# Patient Record
Sex: Male | Born: 1970
Health system: Southern US, Community
[De-identification: ages and names within clinical notes are randomized; demographics above are authoritative.]

## PROBLEM LIST (undated history)

## (undated) DIAGNOSIS — D573 Sickle-cell trait: Secondary | ICD-10-CM

## (undated) DIAGNOSIS — E785 Hyperlipidemia, unspecified: Secondary | ICD-10-CM

## (undated) HISTORY — DX: Hyperlipidemia, unspecified: E78.5

## (undated) HISTORY — PX: COLONOSCOPY: SHX174

## (undated) HISTORY — PX: WISDOM TOOTH EXTRACTION: SHX21

---

## 1998-08-18 ENCOUNTER — Ambulatory Visit (HOSPITAL_COMMUNITY): Admission: RE | Admit: 1998-08-18 | Discharge: 1998-08-18 | Payer: Self-pay | Admitting: Family Medicine

## 1998-08-18 ENCOUNTER — Encounter: Payer: Self-pay | Admitting: Family Medicine

## 2002-08-13 ENCOUNTER — Emergency Department (HOSPITAL_COMMUNITY): Admission: EM | Admit: 2002-08-13 | Discharge: 2002-08-13 | Payer: Self-pay | Admitting: Emergency Medicine

## 2005-09-09 ENCOUNTER — Emergency Department (HOSPITAL_COMMUNITY): Admission: EM | Admit: 2005-09-09 | Discharge: 2005-09-09 | Payer: Self-pay | Admitting: Emergency Medicine

## 2006-11-22 ENCOUNTER — Emergency Department (HOSPITAL_COMMUNITY): Admission: EM | Admit: 2006-11-22 | Discharge: 2006-11-22 | Payer: Self-pay | Admitting: Emergency Medicine

## 2010-04-06 ENCOUNTER — Encounter: Admission: RE | Admit: 2010-04-06 | Discharge: 2010-04-06 | Payer: Self-pay | Admitting: Family Medicine

## 2010-04-07 ENCOUNTER — Encounter: Admission: RE | Admit: 2010-04-07 | Discharge: 2010-04-07 | Payer: Self-pay | Admitting: Family Medicine

## 2011-06-04 ENCOUNTER — Other Ambulatory Visit: Payer: Self-pay

## 2011-10-06 ENCOUNTER — Other Ambulatory Visit: Payer: Self-pay | Admitting: Family Medicine

## 2011-10-07 ENCOUNTER — Ambulatory Visit
Admission: RE | Admit: 2011-10-07 | Discharge: 2011-10-07 | Disposition: A | Discharge status: Home / Self Care | Payer: PRIVATE HEALTH INSURANCE | Source: Ambulatory Visit | Attending: Family Medicine | Admitting: Family Medicine

## 2011-10-07 MED ORDER — IOHEXOL 300 MG/ML  SOLN
125.0000 mL | Freq: Once | INTRAMUSCULAR | Status: AC | PRN
  Administered 2011-10-07: 125 mL via INTRAVENOUS

## 2012-04-04 ENCOUNTER — Emergency Department (HOSPITAL_COMMUNITY): Payer: PRIVATE HEALTH INSURANCE

## 2012-04-04 ENCOUNTER — Observation Stay (HOSPITAL_COMMUNITY): Payer: PRIVATE HEALTH INSURANCE

## 2012-04-04 ENCOUNTER — Encounter (HOSPITAL_COMMUNITY): Payer: Self-pay | Admitting: Emergency Medicine

## 2012-04-04 ENCOUNTER — Observation Stay (HOSPITAL_COMMUNITY)
Admission: EM | Admit: 2012-04-04 | Discharge: 2012-04-04 | Disposition: A | Discharge status: Home / Self Care | Payer: PRIVATE HEALTH INSURANCE | Attending: Emergency Medicine | Admitting: Emergency Medicine

## 2012-04-04 DIAGNOSIS — G479 Sleep disorder, unspecified: Secondary | ICD-10-CM | POA: Insufficient documentation

## 2012-04-04 DIAGNOSIS — R0989 Other specified symptoms and signs involving the circulatory and respiratory systems: Secondary | ICD-10-CM | POA: Insufficient documentation

## 2012-04-04 DIAGNOSIS — R209 Unspecified disturbances of skin sensation: Secondary | ICD-10-CM | POA: Insufficient documentation

## 2012-04-04 DIAGNOSIS — R079 Chest pain, unspecified: Principal | ICD-10-CM | POA: Insufficient documentation

## 2012-04-04 DIAGNOSIS — R0602 Shortness of breath: Secondary | ICD-10-CM

## 2012-04-04 DIAGNOSIS — R0609 Other forms of dyspnea: Secondary | ICD-10-CM | POA: Insufficient documentation

## 2012-04-04 DIAGNOSIS — D573 Sickle-cell trait: Secondary | ICD-10-CM | POA: Insufficient documentation

## 2012-04-04 DIAGNOSIS — R5381 Other malaise: Secondary | ICD-10-CM | POA: Insufficient documentation

## 2012-04-04 HISTORY — DX: Sickle-cell trait: D57.3

## 2012-04-04 LAB — CBC WITH DIFFERENTIAL/PLATELET
Eosinophils Absolute: 0.1 10*3/uL (ref 0.0–0.7)
Hemoglobin: 14.9 g/dL (ref 13.0–17.0)
Lymphs Abs: 1.5 10*3/uL (ref 0.7–4.0)
MCH: 30.5 pg (ref 26.0–34.0)
Monocytes Relative: 10 % (ref 3–12)
Neutro Abs: 4.5 10*3/uL (ref 1.7–7.7)
Neutrophils Relative %: 67 % (ref 43–77)
RBC: 4.89 MIL/uL (ref 4.22–5.81)

## 2012-04-04 LAB — URINALYSIS, ROUTINE W REFLEX MICROSCOPIC
Glucose, UA: NEGATIVE mg/dL
Hgb urine dipstick: NEGATIVE
Specific Gravity, Urine: 1.012 (ref 1.005–1.030)

## 2012-04-04 LAB — BASIC METABOLIC PANEL
CO2: 27 mEq/L (ref 19–32)
Chloride: 101 mEq/L (ref 96–112)
GFR calc non Af Amer: 89 mL/min — ABNORMAL LOW (ref >90)
Glucose, Bld: 100 mg/dL — ABNORMAL HIGH (ref 70–99)
Potassium: 3.9 mEq/L (ref 3.5–5.1)
Sodium: 135 mEq/L (ref 135–145)

## 2012-04-04 LAB — TROPONIN I: Troponin I: <0.3 ng/mL (ref <0.30)

## 2012-04-04 MED ORDER — NITROGLYCERIN 0.4 MG SL SUBL
SUBLINGUAL_TABLET | SUBLINGUAL | Status: AC
  Administered 2012-04-04: 0.4 mg via SUBLINGUAL
  Filled 2012-04-04: qty 25

## 2012-04-04 MED ORDER — METOPROLOL TARTRATE 25 MG PO TABS
100.0000 mg | ORAL_TABLET | Freq: Once | ORAL | Status: AC
  Administered 2012-04-04: 100 mg via ORAL
  Filled 2012-04-04: qty 4

## 2012-04-04 MED ORDER — SODIUM CHLORIDE 0.9 % IV SOLN
Freq: Once | INTRAVENOUS | Status: AC
  Administered 2012-04-04: 15:00:00 via INTRAVENOUS

## 2012-04-04 MED ORDER — METOPROLOL TARTRATE 1 MG/ML IV SOLN
5.0000 mg | Freq: Once | INTRAVENOUS | Status: AC
  Administered 2012-04-04: 5 mg via INTRAVENOUS

## 2012-04-04 MED ORDER — NITROGLYCERIN 0.4 MG SL SUBL
0.4000 mg | SUBLINGUAL_TABLET | Freq: Once | SUBLINGUAL | Status: AC
  Administered 2012-04-04: 0.4 mg via SUBLINGUAL

## 2012-04-04 MED ORDER — IOHEXOL 350 MG/ML SOLN
80.0000 mL | Freq: Once | INTRAVENOUS | Status: AC | PRN
  Administered 2012-04-04: 80 mL via INTRAVENOUS

## 2012-04-04 MED ORDER — METOPROLOL TARTRATE 1 MG/ML IV SOLN
5.0000 mg | Freq: Once | INTRAVENOUS | Status: AC
  Administered 2012-04-04: 5 mg via INTRAVENOUS
  Filled 2012-04-04: qty 5

## 2012-04-04 MED ORDER — METOPROLOL TARTRATE 1 MG/ML IV SOLN
5.0000 mg | Freq: Once | INTRAVENOUS | Status: AC
  Administered 2012-04-04: 5 mg via INTRAVENOUS
  Filled 2012-04-04 (×2): qty 5

## 2012-04-04 NOTE — ED Notes (Signed)
Cp and sob x 1 week  Squeezing and release feeling midsternal   Lip feels numb

## 2012-04-04 NOTE — ED Provider Notes (Signed)
5:15 PM Patient is in CDU under observation, chest pain protocol.  Sign out received from [facility], PA-C.  Patient has now returned from coronary CT.  CT was negative.  Patient reports he is feeling fine now, just hungry.  States all results had been discussed with him up to the point of the CT.  I have discussed CT results with him.  Pt's PCP is [facility].  Plan is for d/c home with PCP follow up.  Pt verbalizes understanding and agrees with plan.     Results for orders placed during the hospital encounter of 04/04/12  TROPONIN I      Component Value Range   Troponin I <0.30  <0.30 ng/mL  BASIC METABOLIC PANEL      Component Value Range   Sodium 135  135 - 145 mEq/L   Potassium 3.9  3.5 - 5.1 mEq/L   Chloride 101  96 - 112 mEq/L   CO2 27  19 - 32 mEq/L   Glucose, Bld 100 (*) 70 - 99 mg/dL   BUN 12  6 - 23 mg/dL   Creatinine, Ser 4.09  0.50 - 1.35 mg/dL   Calcium 9.3  8.4 - 81.1 mg/dL   GFR calc non Af Amer 89 (*) >90 mL/min   GFR calc Af Amer >90  >90 mL/min  CBC WITH DIFFERENTIAL      Component Value Range   WBC 6.7  4.0 - 10.5 K/uL   RBC 4.89  4.22 - 5.81 MIL/uL   Hemoglobin 14.9  13.0 - 17.0 g/dL   HCT 91.4  78.2 - 95.6 %   MCV 85.9  78.0 - 100.0 fL   MCH 30.5  26.0 - 34.0 pg   MCHC 35.5  30.0 - 36.0 g/dL   RDW 21.3  08.6 - 57.8 %   Platelets 192  150 - 400 K/uL   Neutrophils Relative 67  43 - 77 %   Neutro Abs 4.5  1.7 - 7.7 K/uL   Lymphocytes Relative 22  12 - 46 %   Lymphs Abs 1.5  0.7 - 4.0 K/uL   Monocytes Relative 10  3 - 12 %   Monocytes Absolute 0.7  0.1 - 1.0 K/uL   Eosinophils Relative 1  0 - 5 %   Eosinophils Absolute 0.1  0.0 - 0.7 K/uL   Basophils Relative 0  0 - 1 %   Basophils Absolute 0.0  0.0 - 0.1 K/uL  URINALYSIS, ROUTINE W REFLEX MICROSCOPIC      Component Value Range   Color, Urine YELLOW  YELLOW   APPearance CLEAR  CLEAR   Specific Gravity, Urine 1.012  1.005 - 1.030   pH 7.0  5.0 - 8.0   Glucose, UA NEGATIVE  NEGATIVE mg/dL   Hgb  urine dipstick NEGATIVE  NEGATIVE   Bilirubin Urine NEGATIVE  NEGATIVE   Ketones, ur NEGATIVE  NEGATIVE mg/dL   Protein, ur NEGATIVE  NEGATIVE mg/dL   Urobilinogen, UA 0.2  0.0 - 1.0 mg/dL   Nitrite NEGATIVE  NEGATIVE   Leukocytes, UA NEGATIVE  NEGATIVE  TROPONIN I      Component Value Range   Troponin I <0.30  <0.30 ng/mL   Dg Chest 2 View  04/04/2012  *RADIOLOGY REPORT*  Clinical Data: Chest pain  CHEST - 2 VIEW  Comparison: None.  Findings:  Lungs clear.  Heart size and pulmonary vascularity are normal.  No adenopathy.  No bone lesions.  IMPRESSION:   Lungs clear.  Original Report Authenticated By: Arvin Collard. WOODRUFF III, M.D.    Ct Heart Morp W/cta Cor W/score W/ca W/cm &/or Wo/cm  04/04/2012  *RADIOLOGY REPORT*  INDICATION:  41 year old male with chest pain and shortness of breath.  CT ANGIOGRAPHY OF THE HEART, CORONARY ARTERY, STRUCTURE, AND MORPHOLOGY  CONTRAST: 80mL OMNIPAQUE IOHEXOL 350 MG/ML SOLN  COMPARISON:  No priors.  TECHNIQUE:  CT angiography of the coronary vessels was performed on a 256 channel system using prospective ECG gating.  A scout and noncontrast exam (for calcium scoring) were performed.  Circulation time was measured using a test bolus.  Coronary CTA was performed with sub mm slice collimation during portions of the cardiac cycle after prior injection of iodinated contrast.  Imaging post processing was performed on an independent workstation creating multiplanar and 3-D images, and quantitative analysis of the heart and coronary arteries.  Note that this exam targets the heart and the chest was not imaged in its entirety.  PREMEDICATION: Lopressor 100 mg, P.O. Lopressor 15 mg, IV Nitroglycerin 400 mcg, sublingual.  FINDINGS: Technical quality:  Acceptable.  There is a phase misregistration artifact (related to significant beat-to-beat variability) that does not affect interpretability.  Heart rate:  51 - 59 beats per minute  CORONARY ARTERIES: Left main coronary  artery:  Negative. Left anterior descending:  Negative. Left circumflex:  Negative. Right coronary artery:  Negative. Posterior descending artery:  Negative. Dominance:  Codominant.  CORONARY CALCIUM: Total Agatston Score:  0 MESA database percentile:  N/A  AORTA AND PULMONARY MEASUREMENTS: Aortic root (21 - 40 mm):             25 mm  at the annulus             34 mm  at the sinuses of Valsalva             28 mm  at the sinotubular junction Ascending aorta ( <  40 mm):  29 mm Descending aorta ( <  40 mm):  27 mm Main pulmonary artery:  ( <  30 mm):  30 mm  EXTRACARDIAC FINDINGS: Within the visualized thorax there is no acute consolidative airspace disease, no suspicious-appearing pulmonary nodule or mass, and no pleural effusions.  No pericardial fluid, thickening or pericardial calcification.  Visualized portions of the upper abdomen are unremarkable. There are no aggressive appearing lytic or blastic lesions noted in the visualized portions of the skeleton.  IMPRESSION: 1. No evidence of significant coronary artery atherosclerosis. 2. No acute findings in the visualized thorax to account for the patient's symptoms. 3.  Codominance of the coronary arteries.  Report was called to CDU mid level at [phone] at 04:50 p.m. on 04/04/2012.   Original Report Authenticated By: Florencia Reasons, M.D.       [place], [place] 04/04/12 [phone]

## 2012-04-04 NOTE — ED Provider Notes (Signed)
History     CSN: [id number]  Arrival date & time 04/04/12  4098   First MD Initiated Contact with Patient 04/04/12 [phone]      No chief complaint on file.   (Consider location/radiation/quality/duration/timing/severity/associated sxs/prior treatment) HPI Comments: Elijah Brown 41 y.o. male   The chief complaint is: Patient presents with:   Chest Pain   Shortness of Breath    C/o 1 week of intermittent cp. Patient states pain is left sided. Aching, squeezing. Does not radiate. No associated vomiting or diaphoresis. Does have associated weakness, sob, perioral paresthesia, and racing heart.  Worse with activity, better with rest, but may come on at any time. Does not wake him from sleep. Patient states that today cp became more severe. 6/10.  Now resolved.   He has been under significant stress. Poor sleep with snoring and periods of apnea per wife. + fatigue_ Work up for similar complaint 1 year ago includes negative stress echo and neg findings on holter monitor.  NO hx dvt or pe.  Non smoker, no DM, HTN, hx or early MI in Family.  No GERD. Denies hx anxiety but seems neurotic and anxious in presentation. Denies fevers, chills, myalgias, arthralgias, nausea,  diarrhea. Denies night sweats, unexplained weight loss.    The history is provided by the patient and the spouse. No language interpreter was used.    Past Medical History  Diagnosis Date  . Sickle cell trait     No past surgical history on file.  No family history on file.  History  Substance Use Topics  . Smoking status: Never Smoker   . Smokeless tobacco: Not on file  . Alcohol Use: Yes      Review of Systems  Constitutional: Positive for fatigue. Negative for fever, chills and unexpected weight change.  HENT: Negative for neck pain and neck stiffness.   Eyes: Negative for visual disturbance.  Respiratory: Positive for chest tightness and shortness of breath. Negative for cough.   Cardiovascular:  Positive for chest pain. Negative for palpitations.  Gastrointestinal: Negative for vomiting, abdominal pain, diarrhea and constipation.  Genitourinary: Negative for dysuria, urgency and frequency.  Musculoskeletal: Negative for myalgias and arthralgias.  Skin: Negative for rash.  Neurological: Positive for weakness and numbness (perioral). Negative for tremors, seizures, syncope, speech difficulty and headaches.  Psychiatric/Behavioral: Positive for disturbed wake/sleep cycle.  All other systems reviewed and are negative.    Allergies  Review of patient's allergies indicates no known allergies.  Home Medications  No current outpatient prescriptions on file.  BP 133/88  Pulse 79  Temp 98 F (36.7 C) (Oral)  Resp 18  SpO2 98%  Physical Exam  Nursing note and vitals reviewed. Constitutional: He is oriented to person, place, and time. He appears well-developed and well-nourished. No distress.  HENT:  Head: Normocephalic and atraumatic.  Eyes: Conjunctivae normal are normal. No scleral icterus.  Neck: Normal range of motion. Neck supple. No JVD present. No thyromegaly present.  Cardiovascular: Normal rate, regular rhythm, normal heart sounds and intact distal pulses.  Exam reveals no gallop and no friction rub.   No murmur heard. Pulmonary/Chest: Effort normal and breath sounds normal. No respiratory distress.  Abdominal: Soft. Bowel sounds are normal. There is no tenderness.  Musculoskeletal: He exhibits no edema.  Neurological: He is alert and oriented to person, place, and time. He has normal reflexes. No cranial nerve deficit.  Skin: Skin is warm and dry. He is not diaphoretic.  Psychiatric: His behavior is  normal.    ED Course  Procedures (including critical care time) Results for orders placed during the hospital encounter of 04/04/12  TROPONIN I      Component Value Range   Troponin I <0.30  <0.30 ng/mL  BASIC METABOLIC PANEL      Component Value Range   Sodium 135   135 - 145 mEq/L   Potassium 3.9  3.5 - 5.1 mEq/L   Chloride 101  96 - 112 mEq/L   CO2 27  19 - 32 mEq/L   Glucose, Bld 100 (*) 70 - 99 mg/dL   BUN 12  6 - 23 mg/dL   Creatinine, Ser 8.11  0.50 - 1.35 mg/dL   Calcium 9.3  8.4 - 91.4 mg/dL   GFR calc non Af Amer 89 (*) >90 mL/min   GFR calc Af Amer >90  >90 mL/min  CBC WITH DIFFERENTIAL      Component Value Range   WBC 6.7  4.0 - 10.5 K/uL   RBC 4.89  4.22 - 5.81 MIL/uL   Hemoglobin 14.9  13.0 - 17.0 g/dL   HCT 78.2  95.6 - 21.3 %   MCV 85.9  78.0 - 100.0 fL   MCH 30.5  26.0 - 34.0 pg   MCHC 35.5  30.0 - 36.0 g/dL   RDW 08.6  57.8 - 46.9 %   Platelets 192  150 - 400 K/uL   Neutrophils Relative 67  43 - 77 %   Neutro Abs 4.5  1.7 - 7.7 K/uL   Lymphocytes Relative 22  12 - 46 %   Lymphs Abs 1.5  0.7 - 4.0 K/uL   Monocytes Relative 10  3 - 12 %   Monocytes Absolute 0.7  0.1 - 1.0 K/uL   Eosinophils Relative 1  0 - 5 %   Eosinophils Absolute 0.1  0.0 - 0.7 K/uL   Basophils Relative 0  0 - 1 %   Basophils Absolute 0.0  0.0 - 0.1 K/uL  URINALYSIS, ROUTINE W REFLEX MICROSCOPIC      Component Value Range   Color, Urine YELLOW  YELLOW   APPearance CLEAR  CLEAR   Specific Gravity, Urine 1.012  1.005 - 1.030   pH 7.0  5.0 - 8.0   Glucose, UA NEGATIVE  NEGATIVE mg/dL   Hgb urine dipstick NEGATIVE  NEGATIVE   Bilirubin Urine NEGATIVE  NEGATIVE   Ketones, ur NEGATIVE  NEGATIVE mg/dL   Protein, ur NEGATIVE  NEGATIVE mg/dL   Urobilinogen, UA 0.2  0.0 - 1.0 mg/dL   Nitrite NEGATIVE  NEGATIVE   Leukocytes, UA NEGATIVE  NEGATIVE  TROPONIN I      Component Value Range   Troponin I <0.30  <0.30 ng/mL     Date: 04/04/2012  Rate: 73  Rhythm: normal sinus rhythm  QRS Axis: normal  Intervals: normal  ST/T Wave abnormalities: normal  Conduction Disutrbances: none  Narrative Interpretation: normal ecg  Old EKG Reviewed: no ekg for comparison.   No results found. chest X-ray negative  1. Chest pain   2. Shortness of breath        MDM  Patient with negative EKG and 2 negative troponins.  Sxs seem consistent with anxiety and possible hyperventilation causing perioral numbness.  I have spoken with patient and his h\wife about CT Angio of heart.  Patient agrees that he would like to definitively r.o cardiac ischemia due to artery clogging as origin. I will move patient to CDU on cp protocol.  Arthor Captain, PA-C 04/04/12 2100

## 2012-04-04 NOTE — ED Notes (Signed)
Pt c/o cp and sob x 1 week that comes and goes with some nausea at the time of the pain. Pt stated that he also has some tingling in his lips. Denies dizziness.

## 2012-04-10 NOTE — ED Provider Notes (Signed)
Medical screening examination/treatment/procedure(s) were performed by non-physician practitioner and as supervising physician I was immediately available for consultation/collaboration.   Suzi Roots, MD 04/10/12 Marlyne Beards

## 2012-04-10 NOTE — ED Provider Notes (Signed)
Medical screening examination/treatment/procedure(s) were performed by non-physician practitioner and as supervising physician I was immediately available for consultation/collaboration.   Suzi Roots, MD 04/10/12 0005

## 2014-02-21 ENCOUNTER — Encounter: Payer: Self-pay | Admitting: *Deleted

## 2014-08-03 ENCOUNTER — Encounter (HOSPITAL_COMMUNITY): Payer: Self-pay | Admitting: Emergency Medicine

## 2014-08-03 ENCOUNTER — Emergency Department (HOSPITAL_COMMUNITY)
Admission: EM | Admit: 2014-08-03 | Discharge: 2014-08-03 | Disposition: A | Discharge status: Home / Self Care | Payer: BLUE CROSS/BLUE SHIELD | Attending: Emergency Medicine | Admitting: Emergency Medicine

## 2014-08-03 DIAGNOSIS — M25511 Pain in right shoulder: Secondary | ICD-10-CM | POA: Diagnosis present

## 2014-08-03 MED ORDER — NAPROXEN 500 MG PO TABS
500.0000 mg | ORAL_TABLET | Freq: Two times a day (BID) | ORAL | Status: DC
Start: 2014-08-03 — End: 2015-03-11

## 2014-08-03 NOTE — Discharge Instructions (Signed)
Shoulder Pain °The shoulder is the joint that connects your arms to your body. The bones that form the shoulder joint include the upper arm bone (humerus), the shoulder blade (scapula), and the collarbone (clavicle). The top of the humerus is shaped like a ball and fits into a rather flat socket on the scapula (glenoid cavity). A combination of muscles and strong, fibrous tissues that connect muscles to bones (tendons) support your shoulder joint and hold the ball in the socket. Small, fluid-filled sacs (bursae) are located in different areas of the joint. They act as cushions between the bones and the overlying soft tissues and help reduce friction between the gliding tendons and the bone as you move your arm. Your shoulder joint allows a wide range of motion in your arm. This range of motion allows you to do things like scratch your back or throw a ball. However, this range of motion also makes your shoulder more prone to pain from overuse and injury. °Causes of shoulder pain can originate from both injury and overuse and usually can be grouped in the following four categories: °· Redness, swelling, and pain (inflammation) of the tendon (tendinitis) or the bursae (bursitis). °· Instability, such as a dislocation of the joint. °· Inflammation of the joint (arthritis). °· Broken bone (fracture). °HOME CARE INSTRUCTIONS  °· Apply ice to the sore area. °· Put ice in a plastic bag. °· Place a towel between your skin and the bag. °· Leave the ice on for 15-20 minutes, 3-4 times per day for the first 2 days, or as directed by your health care provider. °· Stop using cold packs if they do not help with the pain. °· If you have a shoulder sling or immobilizer, wear it as long as your caregiver instructs. Only remove it to shower or bathe. Move your arm as little as possible, but keep your hand moving to prevent swelling. °· Squeeze a soft ball or foam pad as much as possible to help prevent swelling. °· Only take  over-the-counter or prescription medicines for pain, discomfort, or fever as directed by your caregiver. °SEEK MEDICAL CARE IF:  °· Your shoulder pain increases, or new pain develops in your arm, hand, or fingers. °· Your hand or fingers become cold and numb. °· Your pain is not relieved with medicines. °SEEK IMMEDIATE MEDICAL CARE IF:  °· Your arm, hand, or fingers are numb or tingling. °· Your arm, hand, or fingers are significantly swollen or turn white or blue. °MAKE SURE YOU:  °· Understand these instructions. °· Will watch your condition. °· Will get help right away if you are not doing well or get worse. °Document Released: 03/17/2005 Document Revised: 10/22/2013 Document Reviewed: 05/22/2011 °Exit[employer]® Patient Information ©2015 [employer]. This information is not intended to replace advice given to you by your health care provider. Make sure you discuss any questions you have with your health care provider. °Shoulder Range of Motion Exercises °The shoulder is the most flexible joint in the human body. Because of this it is also the most unstable joint in the body. All ages can develop shoulder problems. Early treatment of problems is necessary for a good outcome. People react to shoulder pain by decreasing the movement of the joint. After a brief period of time, the shoulder can become "frozen". This is an almost complete loss of the ability to move the damaged shoulder. Following injuries your caregivers can give you instructions on exercises to keep your range of motion (ability to   move your shoulder freely), or regain it if it has been lost.  °EXERCISES °EXERCISES TO MAINTAIN THE MOBILITY OF YOUR SHOULDER: °Codman's Exercise or Pendulum Exercise °· This exercise may be performed in a prone (face-down) lying position or standing while leaning on a chair with the opposite arm. Its purpose is to relax the muscles in your shoulder and slowly but surely increase the range of motion and to relieve  pain. °¨ Lie on your stomach close to the side edge of the bed. Let your weak arm hang over the edge of the bed. Relax your shoulder, arm and hand. Let your shoulder blade relax and drop down. °¨ Slowly and gently swing your arm forward and back. Do not use your neck muscles; relax them. It might be easier to have someone else gently start swinging your arm. °¨ As pain decreases, increase your swing. To start, arm swing should begin at 15 degree angles. In time and as pain lessens, move to 30-45 degree angles. Start with swinging for about 15 seconds, and work towards swinging for 3 to 5 minutes. °· This exercise may also be performed in a standing/bent over position. °¨ Stand and hold onto a sturdy chair with your good arm. Bend forward at the waist and bend your knees slightly to help protect your back. Relax your weak arm, let it hang limp. Relax your shoulder blade and let it drop. °¨ Keep your shoulder relaxed and use body motion to swing your arm in small circles. °¨ Stand up tall and relax. °¨ Repeat motion and change direction of circles. °¨ Start with swinging for about 30 seconds, and work towards swinging for 3 to 5 minutes. °STRETCHING EXERCISES: °· Lift your arm out in front of you with the elbow bent at 90 degrees. Using your other arm gently pull the elbow forward and across your body. °· Bend one arm behind you with the palm facing outward. Using the other arm, hold a towel or rope and reach this arm up above your head, then bend it at the elbow to move your wrist to behind your neck. Grab the free end of the towel with the hand behind your back. Gently pull the towel up with the hand behind your neck, gradually increasing the pull on the hand behind the small of your back. Then, gradually pull down with the hand behind the small of your back. This will pull the hand and arm behind your neck further. Both shoulders will have an increased range of motion with repetition of this  exercise. °STRENGTHENING EXERCISES: °· Standing with your arm at your side and straight out from your shoulder with the elbow bent at 90 degrees, hold onto a small weight and slowly raise your hand so it points straight up in the air. Repeat this five times to begin with, and gradually increase to ten times. Do this four times per day. As you grow stronger you can gradually increase the weight. °· Repeat the above exercise, only this time using an elastic band. Start with your hand up in the air and pull down until your hand is by your side. As you grow stronger, gradually increase the amount you pull by increasing the number or size of the elastic bands. Use the same amount of repetitions. °· Standing with your hand at your side and holding onto a weight, gradually lift the hand in front of you until it is over your head. Do the same also with the hand remaining at   your side and lift the hand away from your body until it is again over your head. Repeat this five times to begin with, and gradually increase to ten times. Do this four times per day. As you grow stronger you can gradually increase the weight. °Document Released: 03/06/2003 Document Revised: 06/12/2013 Document Reviewed: 06/07/2005 °Exit[employer]® Patient Information ©2015 [employer]. This information is not intended to replace advice given to you by your health care provider. Make sure you discuss any questions you have with your health care provider. ° °

## 2014-08-03 NOTE — ED Notes (Signed)
Pt. reports chronic right shoulder pain for several years worse these past several days unrelieved by OTC pain medications , denies recent injury .

## 2014-08-03 NOTE — ED Provider Notes (Signed)
CSN: [id number]     Arrival date & time 08/03/14  1955 History  This chart was scribed for Elijah Farrier, PA-C working with Tilden Fossa, MD by Elijah Brown, ED Scribe. This patient was seen in room TR08C/TR08C and the patient's care was started at 8:46 PM.     Chief Complaint  Patient presents with  . Shoulder Pain   The history is provided by the patient. No language interpreter was used.   HPI Comments: Elijah Brown is a 44 y.o. male who presents to the Emergency Department complaining of atraumatic chronic right shoulder pain that has recently worsened over the past 2 days. Pt rates the pain as 8/10. She reports pain in his shoulder with movement of his arm.. Pt states that he has tingling that runs down into his arm, he denies numbness.. Pt states that he has tried flexeril with no relief. Pt denies weakness, numbness or other related symptoms. Pt denies any recent injury or trauma to the shoulder. Pt denies any recent heavy lifting.  PCP Dr. Merri Brunette    Past Medical History  Diagnosis Date  . Sickle cell trait    Past Surgical History  Procedure Laterality Date  . Wisdom tooth extraction    . Colonoscopy     Family History  Problem Relation Age of Onset  . Heart attack Neg Hx   . Kidney disease Neg Hx   . Sudden death Neg Hx   . Stroke Neg Hx    History  Substance Use Topics  . Smoking status: Never Smoker   . Smokeless tobacco: Not on file  . Alcohol Use: Yes     Comment: occasionally     Review of Systems  Constitutional: Negative for fever.  Musculoskeletal: Positive for arthralgias. Negative for neck pain and neck stiffness.  Neurological: Negative for weakness and numbness.    Allergies  Review of patient's allergies indicates no known allergies.  Home Medications   Prior to Admission medications   Medication Sig Start Date End Date Taking? Authorizing Provider  ibuprofen (ADVIL,MOTRIN) 200 MG tablet Take 600 mg by mouth every 6 (six) hours  as needed. For pain    Historical Provider, MD  naproxen (NAPROSYN) 500 MG tablet Take 1 tablet (500 mg total) by mouth 2 (two) times daily with a meal. 08/03/14   Elijah Gip Satvik Parco, PA-C   BP 117/83 mmHg  Pulse 94  Temp(Src) 98.2 F (36.8 C) (Oral)  Resp 16  Ht  (1.854 m)  Wt 249 lb (112.946 kg)  BMI 32.86 kg/m2  SpO2 96%   Physical Exam  Constitutional: He is oriented to person, place, and time. He appears well-developed and well-nourished. No distress.  HENT:  Head: Normocephalic and atraumatic.  Eyes: Right eye exhibits no discharge. Left eye exhibits no discharge.  Neck: Neck supple.  Cardiovascular: Normal rate, regular rhythm, normal heart sounds and intact distal pulses.   Pulmonary/Chest: Effort normal. No respiratory distress.  Musculoskeletal: He exhibits tenderness.  Right shoulder active rom limited due to pain. Full passive ROM. Bilateral radial pulses intact. Equal grip strengths bilaterally. No bony point tenderness. No edema, erythema or ecchymosis noted. No deformity noted.  Lymphadenopathy:    He has no cervical adenopathy.  Neurological: He is alert and oriented to person, place, and time. Coordination normal.  Equal grip strength. Sensation is intact in his distal arm and hands.   Skin: Skin is warm and dry. No rash noted. He is not diaphoretic. No erythema. No  pallor.  Psychiatric: He has a normal mood and affect. His behavior is normal.  Nursing note and vitals reviewed.   ED Course  Procedures (including critical care time) DIAGNOSTIC STUDIES: Oxygen Saturation is 98% on RA, normal by my interpretation.    COORDINATION OF CARE: 9:09 PM-Discussed treatment plan with pt at bedside and pt agreed to plan.     Labs Review Labs Reviewed - No data to display  Imaging Review No results found.   EKG Interpretation None      Filed Vitals:   08/03/14 2011 08/03/14 2119  BP: 131/82 117/83  Pulse: 89 94  Temp: 98 F (36.7 C) 98.2 F (36.8  C)  TempSrc: Oral Oral  Resp: 18 16  Height: 6\' 1"  (1.854 m)   Weight: 249 lb (112.946 kg)   SpO2: 98% 96%     MDM   Meds given in ED:  Medications - No data to display  Discharge Medication List as of 08/03/2014  9:17 PM    START taking these medications   Details  naproxen (NAPROSYN) 500 MG tablet Take 1 tablet (500 mg total) by mouth 2 (two) times daily with a meal., Starting 08/03/2014, Until Discontinued, Print         Final diagnoses:  Right shoulder pain    This is a 44 year old male with chronic right shoulder pain who presents the emergency department complaining of worsening right shoulder pain over the past 2 days. Patient is been taking Flexeril without relief. As any trauma or injury to his shoulder. He denies any numbness or weakness. Patient is afebrile and nontoxic appearing. The patient is neurologically intact. There is no deformity, edema, ecchymosis or bony point tenderness. Patient has full passive range of motion of his right shoulder. No evidence of dislocation. He has intact distal pulses. I discussed with the patient about doing x-rays. Gather we agree that it is unlikely an x-ray will benefit as it is unlikely he broke a bone or dislocated his shoulder. Patient does not wish for x-rays at this time. I agree with this plan. The patient like to try different medication. I discussed shoulder range of motion exercises and gave the patient information on range of motion exercises. We'll try this patient on Naprosyn 500 mg twice a day. I advised the patient he could continue taking Flexeril for his pain as prescribed. I advised the patient to follow-up with their primary care provider this week. I advised the patient to return to the emergency department with new or worsening symptoms or new concerns. The patient verbalized understanding and agreement with plan.    I personally performed the services described in this documentation, which was scribed in my presence.  The recorded information has been reviewed and is accurate.      Elijah ChambersWilliam Brown Elijah Fulgham, PA-C 08/04/14 0013  Tilden FossaElizabeth Rees, MD 08/04/14 2300

## 2015-03-11 ENCOUNTER — Encounter (HOSPITAL_COMMUNITY): Payer: Self-pay | Admitting: Emergency Medicine

## 2015-03-11 ENCOUNTER — Emergency Department (HOSPITAL_COMMUNITY): Payer: BLUE CROSS/BLUE SHIELD

## 2015-03-11 ENCOUNTER — Emergency Department (HOSPITAL_COMMUNITY)
Admission: EM | Admit: 2015-03-11 | Discharge: 2015-03-11 | Disposition: A | Discharge status: Home / Self Care | Payer: BLUE CROSS/BLUE SHIELD | Attending: Emergency Medicine | Admitting: Emergency Medicine

## 2015-03-11 DIAGNOSIS — Z862 Personal history of diseases of the blood and blood-forming organs and certain disorders involving the immune mechanism: Secondary | ICD-10-CM | POA: Diagnosis not present

## 2015-03-11 DIAGNOSIS — R0789 Other chest pain: Secondary | ICD-10-CM

## 2015-03-11 DIAGNOSIS — R079 Chest pain, unspecified: Secondary | ICD-10-CM | POA: Insufficient documentation

## 2015-03-11 DIAGNOSIS — Z791 Long term (current) use of non-steroidal anti-inflammatories (NSAID): Secondary | ICD-10-CM | POA: Diagnosis not present

## 2015-03-11 LAB — CBC WITH DIFFERENTIAL/PLATELET
BASOS ABS: 0 10*3/uL (ref 0.0–0.1)
Basophils Relative: 0 %
EOS ABS: 0 10*3/uL (ref 0.0–0.7)
EOS PCT: 0 %
HCT: 44.4 % (ref 39.0–52.0)
HEMOGLOBIN: 15.5 g/dL (ref 13.0–17.0)
LYMPHS ABS: 1.4 10*3/uL (ref 0.7–4.0)
Lymphocytes Relative: 19 %
MCH: 30.7 pg (ref 26.0–34.0)
MCHC: 34.9 g/dL (ref 30.0–36.0)
MCV: 87.9 fL (ref 78.0–100.0)
MONO ABS: 0.5 10*3/uL (ref 0.1–1.0)
Monocytes Relative: 7 %
NEUTROS PCT: 74 %
Neutro Abs: 5.5 10*3/uL (ref 1.7–7.7)
PLATELETS: ADEQUATE 10*3/uL (ref 150–400)
RBC: 5.05 MIL/uL (ref 4.22–5.81)
RDW: 13.1 % (ref 11.5–15.5)
WBC: 7.4 10*3/uL (ref 4.0–10.5)

## 2015-03-11 LAB — BASIC METABOLIC PANEL
Anion gap: 8 (ref 5–15)
BUN: 10 mg/dL (ref 6–20)
CALCIUM: 8.9 mg/dL (ref 8.9–10.3)
CHLORIDE: 104 mmol/L (ref 101–111)
CO2: 25 mmol/L (ref 22–32)
CREATININE: 1.14 mg/dL (ref 0.61–1.24)
Glucose, Bld: 89 mg/dL (ref 65–99)
Potassium: 4.3 mmol/L (ref 3.5–5.1)
SODIUM: 137 mmol/L (ref 135–145)

## 2015-03-11 LAB — I-STAT TROPONIN, ED: TROPONIN I, POC: 0 ng/mL (ref 0.00–0.08)

## 2015-03-11 MED ORDER — CYCLOBENZAPRINE HCL 10 MG PO TABS: 10.0000 mg | ORAL_TABLET | Freq: Every day | ORAL | Status: DC

## 2015-03-11 MED ORDER — NAPROXEN 500 MG PO TABS: 500.0000 mg | ORAL_TABLET | Freq: Two times a day (BID) | ORAL | Status: DC

## 2015-03-11 NOTE — ED Provider Notes (Signed)
CSN: [id number]     Arrival date & time 03/11/15  4098 History   First MD Initiated Contact with Patient 03/11/15 1108     Chief Complaint  Patient presents with  . Chest Pain     (Consider location/radiation/quality/duration/timing/severity/associated sxs/prior Treatment) HPI Elijah Brown is a 44 yo male with no PMH, presenting with 1.5 weeks chest pain.  Pain is left sided without radiation.  Described as stabbing pain, worsened with movement, laying down, bending/turning, coughing, and deep inspiration.  Pain is also worse with palpation.  He reports mild improvement with Aleve.  He endorses SOB a/w the pain, but says it is more that he catches his breath, not that he is unable to get enough air.  No h/o angina or MI.  He endorses recent sore throat, but was evaluated by his PCP with no intervention.  He endorses starting to lift weights again at the start of August.  He denies pulling a muscle or muscle pain following lifting.  He was cleaning his garage last Saturday, but denies heavy lifting at that time.  He has no h/o HTN, HLD, DM, tobacco use, or obesity. He has a FHx of cardiac disease (PGM and MGF).  He reports a stress test in 2014 or 2015 that was normal. He has had chest pain in the past 2/2 stress.  While he reports being stressed now, he does not see a relation with the current pain.    He has a h/o chicken pox.  He has not had a shingles vaccine.  He denies rash.  The rest of his ROS was negative.  Past Medical History  Diagnosis Date  . Sickle cell trait    Past Surgical History  Procedure Laterality Date  . Wisdom tooth extraction    . Colonoscopy     Family History  Problem Relation Age of Onset  . Heart attack Neg Hx   . Kidney disease Neg Hx   . Sudden death Neg Hx   . Stroke Neg Hx    Social History  Substance Use Topics  . Smoking status: Never Smoker   . Smokeless tobacco: None  . Alcohol Use: Yes     Comment: occasionally     Review of Systems   Constitutional: Positive for fatigue. Negative for fever, chills, diaphoresis and unexpected weight change.  HENT: Negative for congestion and trouble swallowing.   Respiratory: Positive for shortness of breath. Negative for cough, chest tightness and wheezing.   Cardiovascular: Positive for chest pain. Negative for palpitations and leg swelling.  Gastrointestinal: Negative for nausea, vomiting, abdominal pain, diarrhea and constipation.  Endocrine: Positive for polyuria.  Genitourinary: Negative for dysuria, frequency and difficulty urinating.  Musculoskeletal: Negative for myalgias and arthralgias.  Skin: Negative for rash.  Neurological: Positive for weakness.      Allergies  Review of patient's allergies indicates no known allergies.  Home Medications   Prior to Admission medications   Medication Sig Start Date End Date Taking? Authorizing Provider  amoxicillin (AMOXIL) 500 MG tablet Take 1,000 mg by mouth 2 (two) times daily. For 10 days   Yes Historical Provider, MD  ibuprofen (ADVIL,MOTRIN) 200 MG tablet Take 600 mg by mouth every 6 (six) hours as needed. For pain   Yes Historical Provider, MD  naproxen (NAPROSYN) 500 MG tablet Take 1 tablet (500 mg total) by mouth 2 (two) times daily with a meal. 08/03/14  Yes [name], PA-C  Naproxen Sodium (ALEVE) 220 MG CAPS Take 220 mg by  mouth every 6 (six) hours as needed (pain).   Yes Historical Provider, MD   BP 119/85 mmHg  Pulse 72  Temp(Src) 98.4 F (36.9 C) (Oral)  Resp 18  SpO2 99% Physical Exam  Constitutional: He appears well-developed and well-nourished. No distress.  HENT:  Head: Normocephalic and atraumatic.  Eyes: EOM are normal.  Neck: Normal range of motion. No JVD present. No tracheal deviation present.  Cardiovascular: Normal rate, regular rhythm, normal heart sounds and intact distal pulses.   Pain reproduced with palpation over left lateral chest/axilla (over pec minor insertion)  Pulmonary/Chest: Effort  normal and breath sounds normal. No respiratory distress. He has no wheezes.  Abdominal: Soft. He exhibits no distension. There is no tenderness. There is no rebound and no guarding.  Musculoskeletal: Normal range of motion. He exhibits no edema.  Neurological: He is alert.  Skin: Skin is warm and dry. No rash noted.  No skin changes overlying site of pain.    ED Course  Procedures (including critical care time) Labs Review Labs Reviewed  BASIC METABOLIC PANEL  CBC WITH DIFFERENTIAL/PLATELET    Imaging Review Dg Chest 2 View  03/11/2015   CLINICAL DATA:  Left-sided chest pain.  Some shortness of breath.  EXAM: CHEST  2 VIEW  COMPARISON:  CT of the chest dated 04/04/2012  FINDINGS: Cardiomediastinal silhouette is normal. Mediastinal contours appear intact.  There is no evidence of focal airspace consolidation, pleural effusion or pneumothorax.  Osseous structures are without acute abnormality. Soft tissues are grossly normal.  IMPRESSION: No radiographic evidence of acute cardiopulmonary abnormality.   Electronically Signed   By: Ted Mcalpine M.D.   On: 03/11/2015 10:27   I have personally reviewed and evaluated these images and lab results as part of my medical decision-making.   EKG Interpretation   Date/Time:  Tuesday March 11 2015 09:43:28 EDT Ventricular Rate:  72 PR Interval:  140 QRS Duration: 80 QT Interval:  392 QTC Calculation: 429 R Axis:   69 Text Interpretation:  Normal sinus rhythm Normal ECG No significant change  since last tracing Confirmed by Anitra Lauth  MD, WHITNEY ([id number]) on 03/11/2015  11:08:47 AM      MDM   Final diagnoses:  None   Noncardiac Chest Pain: Pain unchanged for 1.5 weeks, worsened with cough and bending/movement.  Reproduced with palpation.  Likely MSK.  ECG normal.  HEART score 1 (family history; 0.9-1.7% risk).   Wells DVT 0.  PERC 0.  Troponin negative.  CXR negative.  No recent infection or pericardial rub concerning for  pericarditis.  Does not appear anxious at this time.  No overlying skin changes, excessive stress, or immunosuppression concerning for Zoster.   - Naproxen 500 mg q12hours - Flexeril 10 mg qHS - No heavy lifting    Jana Half, MD 03/11/15 1309  Gwyneth Sprout, MD 03/11/15 1710

## 2015-03-11 NOTE — ED Notes (Signed)
Doctor at bedside.

## 2015-03-11 NOTE — Discharge Instructions (Signed)
1. Take Naproxen 500 mg, 1 tablet every 12 hours with food.  Do not take other NSAID medications (Advil, Aleve) while taking this medication. 2. Take Flexeril 10 mg, 1 tablet at night for muscle pain 3. Avoid heavy lifting until pain resolves.

## 2015-03-11 NOTE — ED Notes (Signed)
Pt sts left sided CP and rib pain x 1 week worse with cough and movement

## 2016-04-29 DIAGNOSIS — Z131 Encounter for screening for diabetes mellitus: Secondary | ICD-10-CM | POA: Diagnosis not present

## 2016-04-29 DIAGNOSIS — Z125 Encounter for screening for malignant neoplasm of prostate: Secondary | ICD-10-CM | POA: Diagnosis not present

## 2016-04-29 DIAGNOSIS — Z0000 Encounter for general adult medical examination without abnormal findings: Secondary | ICD-10-CM | POA: Diagnosis not present

## 2016-04-29 DIAGNOSIS — Z13220 Encounter for screening for lipoid disorders: Secondary | ICD-10-CM | POA: Diagnosis not present

## 2016-04-29 DIAGNOSIS — Z23 Encounter for immunization: Secondary | ICD-10-CM | POA: Diagnosis not present

## 2016-05-31 DIAGNOSIS — R300 Dysuria: Secondary | ICD-10-CM | POA: Diagnosis not present

## 2017-02-25 DIAGNOSIS — Z800 Family history of malignant neoplasm of digestive organs: Secondary | ICD-10-CM | POA: Diagnosis not present

## 2017-02-25 DIAGNOSIS — Z86010 Personal history of colonic polyps: Secondary | ICD-10-CM | POA: Diagnosis not present

## 2017-05-03 DIAGNOSIS — R072 Precordial pain: Secondary | ICD-10-CM | POA: Diagnosis not present

## 2017-05-05 ENCOUNTER — Telehealth: Payer: Self-pay

## 2017-05-05 NOTE — Telephone Encounter (Signed)
Notes sent to scheduling.   

## 2017-05-06 ENCOUNTER — Ambulatory Visit (INDEPENDENT_AMBULATORY_CARE_PROVIDER_SITE_OTHER): Payer: BLUE CROSS/BLUE SHIELD | Admitting: Cardiovascular Disease

## 2017-05-06 ENCOUNTER — Encounter: Payer: Self-pay | Admitting: Cardiovascular Disease

## 2017-05-06 VITALS — BP 100/80 | HR 78 | Ht 73.0 in | Wt 248.2 lb

## 2017-05-06 DIAGNOSIS — R0789 Other chest pain: Secondary | ICD-10-CM | POA: Diagnosis not present

## 2017-05-06 NOTE — Patient Instructions (Signed)
Medication Instructions: Your physician recommends that you continue on your current medications as directed. Please refer to the Current Medication list given to you today.   Testing/Procedures: Your physician has requested that you have an echocardiogram. Echocardiography is a painless test that uses sound waves to create images of your heart. It provides your doctor with information about the size and shape of your heart and how well your heart's chambers and valves are working. This procedure takes approximately one hour. There are no restrictions for this procedure.  Your physician has requested that you have an exercise tolerance test. For further information please visit [email or web address]. Please also follow instruction sheet, as given.  Follow-Up: Your physician recommends that you schedule a follow-up appointment in: 3 months with Dr. Allyson SabalBerry.  If you need a refill on your cardiac medications before your next appointment, please call your pharmacy.

## 2017-05-06 NOTE — Progress Notes (Signed)
05/06/2017 Elijah Brown   04-Jun-1971  [id number]  Primary Physician Elijah Brown, Candace, MD Primary Cardiologist: Elijah GessJonathan J Shyler Holzman MD Elijah CalamityFACP, FACC, FAHA, [place]  HPI:  Elijah Brown is a 46 y.o. male African-American male father of one 46 year old daughter who works as a Radio producerquality engineer at [employer]. He was referred by [name] for cardiovascular evaluation because of atypical chest pain of new onset. He has no cardiac risk factors. There is no family history. The pain began 2 weeks ago and occurs mostly in the morning. He'll last for hours at a time. There is a positional component. It does not radiate. There is occasional shortness of breath.   Current Meds  Medication Sig  . ibuprofen (ADVIL,MOTRIN) 200 MG tablet Take 200 mg every 6 (six) hours as needed by mouth.  . ranitidine (ZANTAC) 150 MG tablet Take 150 mg 2 (two) times daily by mouth.     No Known Allergies  Social History   Socioeconomic History  . Marital status: Single    Spouse name: Not on file  . Number of children: Not on file  . Years of education: Not on file  . Highest education level: Not on file  Social Needs  . Financial resource strain: Not on file  . Food insecurity - worry: Not on file  . Food insecurity - inability: Not on file  . Transportation needs - medical: Not on file  . Transportation needs - non-medical: Not on file  Occupational History  . Not on file  Tobacco Use  . Smoking status: Never Smoker  . Smokeless tobacco: Never Used  Substance and Sexual Activity  . Alcohol use: Yes    Comment: occasionally   . Drug use: No  . Sexual activity: Not on file  Other Topics Concern  . Not on file  Social History Narrative  . Not on file     Review of Systems: General: negative for chills, fever, night sweats or weight changes.  Cardiovascular: negative for chest pain, dyspnea on exertion, edema, orthopnea, palpitations, paroxysmal nocturnal dyspnea or shortness of  breath Dermatological: negative for rash Respiratory: negative for cough or wheezing Urologic: negative for hematuria Abdominal: negative for nausea, vomiting, diarrhea, bright red blood per rectum, melena, or hematemesis Neurologic: negative for visual changes, syncope, or dizziness All other systems reviewed and are otherwise negative except as noted above.    Blood pressure 100/80, pulse 78, height 6\' 1"  (1.854 m), weight 248 lb 3.2 oz (112.6 kg), SpO2 99 %.  General appearance: alert and no distress Neck: no adenopathy, no carotid bruit, no JVD, supple, symmetrical, trachea midline and thyroid not enlarged, symmetric, no tenderness/mass/nodules Lungs: clear to auscultation bilaterally Heart: regular rate and rhythm, S1, S2 normal, no murmur, click, rub or gallop Extremities: extremities normal, atraumatic, no cyanosis or edema Pulses: 2+ and symmetric Skin: Skin color, texture, turgor normal. No rashes or lesions Neurologic: Alert and oriented X 3, normal strength and tone. Normal symmetric reflexes. Normal coordination and gait  EKG sinus rhythm at 78 without ST or T-wave changes. I personally reviewed this EKG  ASSESSMENT AND PLAN:   Atypical chest pain Elijah Brown was referred by [name] for evaluation of atypical chest pain. He has basically no cardiac risk factors. The pain started approximately 2-3 weeks ago. It is somewhat positional. It occurs mostly in the morning and lasts for hours at a time. It does not radiate. There is occasional shortness of breath. I'm going to  get a routine GXT and a 2-D echocardiogram.      Elijah GessJonathan J. Tycen Dockter MD [facility] HospitalFACP,FACC,FAHA, [facility]SCAI 05/06/2017 2:20 PM

## 2017-05-06 NOTE — Assessment & Plan Note (Signed)
Mr. Elijah Brown was referred by Dr. Merri Brunetteandace Smith for evaluation of atypical chest pain. He has basically no cardiac risk factors. The pain started approximately 2-3 weeks ago. It is somewhat positional. It occurs mostly in the morning and lasts for hours at a time. It does not radiate. There is occasional shortness of breath. I'm going to get a routine GXT and a 2-D echocardiogram.

## 2017-05-19 ENCOUNTER — Other Ambulatory Visit (HOSPITAL_COMMUNITY): Payer: PRIVATE HEALTH INSURANCE

## 2017-05-20 ENCOUNTER — Telehealth: Payer: Self-pay | Admitting: Cardiovascular Disease

## 2017-05-20 ENCOUNTER — Telehealth (HOSPITAL_COMMUNITY): Payer: Self-pay

## 2017-05-20 NOTE — Telephone Encounter (Signed)
Received incoming records from [facility] for upcoming appointment on 08/09/17 @ 9am with Dr. Allyson SabalBerry. [facility] 05/20/17 ab

## 2017-05-20 NOTE — Telephone Encounter (Signed)
Unable To Reach  Encounter complete.

## 2017-05-24 ENCOUNTER — Telehealth (HOSPITAL_COMMUNITY): Payer: Self-pay

## 2017-05-24 NOTE — Telephone Encounter (Signed)
Encounter complete. 

## 2017-05-25 ENCOUNTER — Ambulatory Visit (HOSPITAL_COMMUNITY)
Admission: RE | Admit: 2017-05-25 | Discharge: 2017-05-25 | Disposition: A | Discharge status: Home / Self Care | Payer: BLUE CROSS/BLUE SHIELD | Source: Ambulatory Visit | Attending: Cardiovascular Disease | Admitting: Cardiovascular Disease

## 2017-05-25 DIAGNOSIS — R0789 Other chest pain: Secondary | ICD-10-CM | POA: Diagnosis not present

## 2017-05-25 LAB — EXERCISE TOLERANCE TEST
CSEPED: 10 min
CSEPHR: 105 %
CSEPPHR: 184 {beats}/min
Estimated workload: 11.9 METS
Exercise duration (sec): 0 s
MPHR: 174 {beats}/min
RPE: 18
Rest HR: 79 {beats}/min

## 2017-05-31 ENCOUNTER — Other Ambulatory Visit (HOSPITAL_COMMUNITY): Payer: PRIVATE HEALTH INSURANCE

## 2017-06-01 ENCOUNTER — Other Ambulatory Visit (HOSPITAL_COMMUNITY): Payer: BLUE CROSS/BLUE SHIELD

## 2017-06-07 ENCOUNTER — Other Ambulatory Visit: Payer: Self-pay

## 2017-06-07 ENCOUNTER — Ambulatory Visit (HOSPITAL_COMMUNITY): Payer: BLUE CROSS/BLUE SHIELD | Attending: Cardiology

## 2017-06-07 ENCOUNTER — Encounter (INDEPENDENT_AMBULATORY_CARE_PROVIDER_SITE_OTHER): Payer: Self-pay

## 2017-06-07 DIAGNOSIS — R0789 Other chest pain: Secondary | ICD-10-CM | POA: Diagnosis not present

## 2017-07-11 DIAGNOSIS — Z0000 Encounter for general adult medical examination without abnormal findings: Secondary | ICD-10-CM | POA: Diagnosis not present

## 2017-07-11 DIAGNOSIS — J069 Acute upper respiratory infection, unspecified: Secondary | ICD-10-CM | POA: Diagnosis not present

## 2017-07-11 DIAGNOSIS — Z125 Encounter for screening for malignant neoplasm of prostate: Secondary | ICD-10-CM | POA: Diagnosis not present

## 2017-07-11 DIAGNOSIS — Z13220 Encounter for screening for lipoid disorders: Secondary | ICD-10-CM | POA: Diagnosis not present

## 2017-07-11 DIAGNOSIS — Z23 Encounter for immunization: Secondary | ICD-10-CM | POA: Diagnosis not present

## 2017-08-09 ENCOUNTER — Ambulatory Visit: Payer: BLUE CROSS/BLUE SHIELD | Admitting: Cardiovascular Disease

## 2018-04-24 DIAGNOSIS — J069 Acute upper respiratory infection, unspecified: Secondary | ICD-10-CM | POA: Diagnosis not present

## 2018-04-24 DIAGNOSIS — J209 Acute bronchitis, unspecified: Secondary | ICD-10-CM | POA: Diagnosis not present

## 2018-05-09 DIAGNOSIS — J069 Acute upper respiratory infection, unspecified: Secondary | ICD-10-CM | POA: Diagnosis not present

## 2018-10-31 DIAGNOSIS — K602 Anal fissure, unspecified: Secondary | ICD-10-CM | POA: Diagnosis not present

## 2018-10-31 DIAGNOSIS — R569 Unspecified convulsions: Secondary | ICD-10-CM | POA: Diagnosis not present

## 2018-11-06 ENCOUNTER — Telehealth: Payer: Self-pay

## 2018-11-06 NOTE — Telephone Encounter (Signed)
I contacted the pt and completed the pre charting for 11/07/18 virtual visit. Pt confirmed he has received the link for video visit.

## 2018-11-07 ENCOUNTER — Encounter: Payer: Self-pay | Admitting: Neurology

## 2018-11-07 ENCOUNTER — Ambulatory Visit (INDEPENDENT_AMBULATORY_CARE_PROVIDER_SITE_OTHER): Payer: BLUE CROSS/BLUE SHIELD | Admitting: Neurology

## 2018-11-07 ENCOUNTER — Other Ambulatory Visit: Payer: Self-pay

## 2018-11-07 DIAGNOSIS — R404 Transient alteration of awareness: Secondary | ICD-10-CM | POA: Diagnosis not present

## 2018-11-07 NOTE — Progress Notes (Addendum)
Virtual Visit via Video Note  I connected with Elijah Brown on 11/07/18 at  3:00 PM EDT by a video enabled telemedicine application and verified that I am speaking with the correct person using two identifiers.  Location: Patient: The patient is at home. Provider: The physician is in office.   I discussed the limitations of evaluation and management by telemedicine and the availability of in person appointments. The patient expressed understanding and agreed to proceed.  History of Present Illness: Elijah Brown is a 48 year old left-handed black male with a history of sickle cell trait in the past, otherwise he has been fairly healthy.  The patient had an unusual event that occurred on 30 Oct 2018.  The patient was at the dinner table, he was laughing with his daughter, he claimed that the laugh was very robust.  Suddenly, he felt a tightening sensation inside of his stomach that began to generalize, he tried to stand up, and fell over.  The patient never lost consciousness, he did not lose vision, he was able to see things around him.  He felt shaking on the arms and legs, he did not bite his tongue or lose control the bowels or the bladder.  The episode lasted about 20 seconds and then cleared, the patient felt weak and fatigued afterwards.  He did not have a sensation of dizziness per se, he had no palpitations or altered heart sensations, he had no shortness of breath or chest pain.  He had no fever following the event.  He does report some occasional headaches that he attributes to stress at work.  He has had some muscle twitches in his right shoulder that preceded the above event, he denies any neck pain.  He reports that several years ago he had a similar problem when he was laughing, he had a sensation of tightness but he quickly stopped laughing and the event went away.  He does report some fatigue during the day but no excessive daytime drowsiness or sudden onset sleep.  During the  above event, he did not have a sensation of dreaming or hallucinations.  For a day or 2 afterwards, he had some transient numbness in the legs, particularly with the right leg.  This has since gone away, he feels back to normal again.  He had blood work done through his primary care physician which he claims was normal and included a CBC, comprehensive metabolic profile, TSH, and B12 level.  He is sent to this office for an evaluation.   Observations/Objective: The video evaluation reveals the patient is alert and cooperative.  His speech is well enunciated, not aphasic or dysarthric.  He has full extraocular movements.  He is able to protrude the tongue in the midline with good lateral movement of the tongue.  He has good finger-nose-finger and heel shin bilaterally.  Gait is normal.  Tandem gait is normal.  Romberg is negative.  No drift is seen.  Assessment and Plan: 1.  Episode of generalized stiffening and jerking without loss of consciousness  The etiology of the above event is not clear.  It did come on after a robust laughing episode, he had a similar problem several years ago with laughing, but it did not progress to the event described above.  The fact that he did not lose consciousness yet had bilateral arm and leg jerking and stiffening would be inconsistent with a seizure event.  The event also appears to be atypical for what would  be described with a cataplexy episode associated with narcolepsy.  The patient will be set up for MRI of the brain, EEG study will be done.  If the above studies are unremarkable and the patient continues to have events as above, I would consider a 2D echocardiogram.  Follow Up Instructions: 2570-month follow-up with me.   I discussed the assessment and treatment plan with the patient. The patient was provided an opportunity to ask questions and all were answered. The patient agreed with the plan and demonstrated an understanding of the instructions.   The patient  was advised to call back or seek an in-person evaluation if the symptoms worsen or if the condition fails to improve as anticipated.  I provided 30 minutes of non-face-to-face time during this encounter.   York Spanielharles K Alysiana Ethridge, MD

## 2018-11-09 ENCOUNTER — Telehealth: Payer: Self-pay | Admitting: Neurology

## 2018-11-09 DIAGNOSIS — L739 Follicular disorder, unspecified: Secondary | ICD-10-CM | POA: Diagnosis not present

## 2018-11-09 DIAGNOSIS — L910 Hypertrophic scar: Secondary | ICD-10-CM | POA: Diagnosis not present

## 2018-11-09 DIAGNOSIS — T148XXA Other injury of unspecified body region, initial encounter: Secondary | ICD-10-CM | POA: Diagnosis not present

## 2018-11-09 NOTE — Telephone Encounter (Signed)
spoke to the patient he stated he will get back to me once he speaks to his wife due to the cost. I did offer a payment plan.  [employer] [place] Auth: NPR Ref # [id number] on 11/09/18

## 2018-11-09 NOTE — Telephone Encounter (Signed)
Patient returned my call he is scheduled ato 11/14/18 at [facility]

## 2018-11-09 NOTE — Telephone Encounter (Signed)
I have received blood work results from the primary care physician that was done on 31 Oct 2018.  CBC was done with a white blood count of 7.5, hemoglobin of 15.1, hematocrit 46.3, MCV of 92.5, platelets of 205.  Comprehensive metabolic profile revealed a BUN of 17, creatinine 1.35, estimated GFR of 68.  Sodium was 140, potassium 3.8, chloride 103, CO2 28, calcium 9.1, total protein of 7.3, albumin of 4.2, total bili of 0.5, liver profile was unremarkable.  TSH was 1.32, RPR nonreactive, PSA 1.03, cholesterol 194.  Hemoglobin A1c was 5.3.

## 2018-11-14 ENCOUNTER — Other Ambulatory Visit: Payer: Self-pay

## 2018-11-14 ENCOUNTER — Ambulatory Visit: Payer: BLUE CROSS/BLUE SHIELD

## 2018-11-14 DIAGNOSIS — R404 Transient alteration of awareness: Secondary | ICD-10-CM | POA: Diagnosis not present

## 2018-11-14 MED ORDER — GADOBENATE DIMEGLUMINE 529 MG/ML IV SOLN
20.0000 mL | Freq: Once | INTRAVENOUS | Status: AC | PRN
  Administered 2018-11-14: 20 mL via INTRAVENOUS

## 2018-11-16 ENCOUNTER — Telehealth: Payer: Self-pay | Admitting: Neurology

## 2018-11-16 NOTE — Telephone Encounter (Signed)
  I called the patient.  MRI of the brain is normal, EEG study will be done.   MRI brain 11/15/18:  IMPRESSION:   Normal MRI brain (with and without).

## 2018-11-22 ENCOUNTER — Telehealth: Payer: Self-pay | Admitting: Neurology

## 2018-11-22 NOTE — Telephone Encounter (Signed)
LVM to schedule a 3 month follow-up for mid to late August with Dr. Anne Hahn.

## 2018-11-23 ENCOUNTER — Telehealth: Payer: Self-pay | Admitting: Neurology

## 2018-11-23 NOTE — Telephone Encounter (Signed)
I called the patient.  I discussed the purpose of the EEG with him.  I think that cost of a test is the main problem, the patient has a relatively high deductible on his insurance.  Ultimately, whether or not the test is done is up to the patient.  When calling to schedule, please give him some idea what it would cost him for the study.

## 2018-11-23 NOTE — Telephone Encounter (Signed)
11/23/18 - Called patient and attempted to schedule EEG with him per Dr. Anne Hahn' orders. Patient states he is unsure why he needs an EEG due to his MRI results being normal. Patient has requested to be called for clarification by MD or RN.

## 2018-11-23 NOTE — Telephone Encounter (Addendum)
I reached out to the pt and he and I were able to discuss this message. I advised since the MRI was normal the next step would be an EEG. Pt was agreeable to this and I advised him to speak with his insurance about the cost for this .

## 2018-12-13 ENCOUNTER — Telehealth: Payer: Self-pay

## 2018-12-13 NOTE — Telephone Encounter (Signed)
Spoke with patient to schedule EEG. He will call back this afternoon and schedule

## 2019-05-14 DIAGNOSIS — J029 Acute pharyngitis, unspecified: Secondary | ICD-10-CM | POA: Diagnosis not present

## 2019-05-22 ENCOUNTER — Ambulatory Visit (HOSPITAL_COMMUNITY)
Admission: EM | Admit: 2019-05-22 | Discharge: 2019-05-22 | Disposition: A | Discharge status: Home / Self Care | Payer: BC Managed Care – PPO | Attending: Family Medicine | Admitting: Family Medicine

## 2019-05-22 ENCOUNTER — Encounter (HOSPITAL_COMMUNITY): Payer: Self-pay | Admitting: Emergency Medicine

## 2019-05-22 ENCOUNTER — Other Ambulatory Visit: Payer: Self-pay

## 2019-05-22 DIAGNOSIS — Z20822 Contact with and (suspected) exposure to covid-19: Secondary | ICD-10-CM

## 2019-05-22 DIAGNOSIS — J029 Acute pharyngitis, unspecified: Secondary | ICD-10-CM | POA: Diagnosis not present

## 2019-05-22 DIAGNOSIS — R0602 Shortness of breath: Secondary | ICD-10-CM | POA: Diagnosis not present

## 2019-05-22 DIAGNOSIS — Z20828 Contact with and (suspected) exposure to other viral communicable diseases: Secondary | ICD-10-CM | POA: Diagnosis not present

## 2019-05-22 LAB — POC SARS CORONAVIRUS 2 AG -  ED: SARS Coronavirus 2 Ag: NEGATIVE

## 2019-05-22 LAB — POCT RAPID STREP A: Streptococcus, Group A Screen (Direct): NEGATIVE

## 2019-05-22 LAB — POC SARS CORONAVIRUS 2 AG: SARS Coronavirus 2 Ag: NEGATIVE

## 2019-05-22 MED ORDER — ALBUTEROL SULFATE HFA 108 (90 BASE) MCG/ACT IN AERS: 1.0000 | INHALATION_SPRAY | Freq: Four times a day (QID) | RESPIRATORY_TRACT | 0 refills | Status: DC | PRN

## 2019-05-22 MED ORDER — CETIRIZINE-PSEUDOEPHEDRINE ER 5-120 MG PO TB12: 1.0000 | ORAL_TABLET | Freq: Every day | ORAL | 0 refills | Status: DC

## 2019-05-22 NOTE — Discharge Instructions (Addendum)
Rapid COVID and Strep test are negative  COVID testing ordered.  It will take between 2-7 days for test results.  Someone will contact you regarding abnormal results.    In the meantime: You should remain isolated in your home for 10 days from symptom onset AND greater than 72 hours after symptoms resolution (absence of fever without the use of fever-reducing medication and improvement in respiratory symptoms), whichever is longer Pro-AIR for Shortness of breathGet plenty of rest and push fluids Zyrtec-D prescribed for nasal congestion, runny nose, and/or sore throat Use medications daily for symptom relief Use OTC medications like ibuprofen or tylenol as needed fever or pain Call or go to the ED if you have any new or worsening symptoms such as fever, worsening cough, shortness of breath, chest tightness, chest pain, turning blue, changes in mental status, etc..Marland Kitchen

## 2019-05-22 NOTE — ED Triage Notes (Signed)
Pt states he has been having SOB x 2 days. Pt states his PCP told to come and get checked out.

## 2019-05-22 NOTE — ED Provider Notes (Signed)
[facility]    CSN: [id number] Arrival date & time: 05/22/19  1748      History   Chief Complaint Chief Complaint  Patient presents with  . Shortness of Breath    HPI Elijah Brown is a 48 y.o. male.   [age] y old male  presents to the urgent care with a complaint of shortness of breath on and off X 3 days .  Denies sick exposure to COVID, flu or strep.  Denies recent travel.  Denies aggravating or alleviating symptoms.  Denies previous COVID infection.   Denies fever, chills, fatigue, nasal congestion, rhinorrhea, sore throat, cough, wheezing, chest pain, nausea, vomiting, changes in bowel or bladder habits.    The history is provided by the patient. No language interpreter was used.    Past Medical History:  Diagnosis Date  . Sickle cell trait [facility](HCC)     Patient Active Problem List   Diagnosis Date Noted  . Atypical chest pain 05/06/2017    Past Surgical History:  Procedure Laterality Date  . COLONOSCOPY    . WISDOM TOOTH EXTRACTION         Home Medications    Prior to Admission medications   Medication Sig Start Date End Date Taking? Authorizing Provider  albuterol (VENTOLIN HFA) 108 (90 Base) MCG/ACT inhaler Inhale 1-2 puffs into the lungs every 6 (six) hours as needed for wheezing or shortness of breath. 05/22/19   Claretha Townshend, Zachery DakinsKomlanvi S, FNP  cetirizine-pseudoephedrine (ZYRTEC-D) 5-120 MG tablet Take 1 tablet by mouth daily. 05/22/19   AvegnoZachery Dakins, Asley Baskerville S, FNP    Family History Family History  Problem Relation Age of Onset  . Heart Problems Mother   . Colon cancer Father   . Heart attack Neg Hx   . Kidney disease Neg Hx   . Sudden death Neg Hx   . Stroke Neg Hx     Social History Social History   Tobacco Use  . Smoking status: Never Smoker  . Smokeless tobacco: Never Used  Substance Use Topics  . Alcohol use: Yes    Comment: occasionally   . Drug use: No     Allergies   Patient has no known allergies.   Review of Systems Review  of Systems  Constitutional: Negative.   HENT: Positive for sore throat. Negative for congestion, sinus pressure and sinus pain.   Respiratory: Positive for shortness of breath. Negative for cough, chest tightness and wheezing.   Cardiovascular: Negative.   Neurological: Negative for headaches.  ROS: All other are negatives  Physical Exam Triage Vital Signs ED Triage Vitals  Enc Vitals Group     BP      Pulse      Resp      Temp      Temp src      SpO2      Weight      Height      Head Circumference      Peak Flow      Pain Score      Pain Loc      Pain Edu?      Excl. in GC?    No data found.  Updated Vital Signs BP (!) 149/101 (BP Location: Right Arm)   Pulse 76   Temp 98.6 F (37 C) (Oral)   Resp 18   Wt 250 lb (113.4 kg)   SpO2 100%   BMI 32.98 kg/m   Visual Acuity Right Eye Distance:   Left Eye  Distance:   Bilateral Distance:    Right Eye Near:   Left Eye Near:    Bilateral Near:     Physical Exam Vitals signs and nursing note reviewed.  Constitutional:      General: He is not in acute distress.    Appearance: He is well-developed and normal weight. He is not ill-appearing or toxic-appearing.  HENT:     Right Ear: Tympanic membrane, ear canal and external ear normal. There is no impacted cerumen.     Left Ear: Tympanic membrane, ear canal and external ear normal. There is no impacted cerumen.  Cardiovascular:     Rate and Rhythm: Normal rate and regular rhythm.     Pulses: Normal pulses.     Heart sounds: Normal heart sounds.  Pulmonary:     Effort: Pulmonary effort is normal. No tachypnea or respiratory distress.     Breath sounds: Normal breath sounds. No decreased breath sounds or wheezing.  Chest:     Chest wall: No tenderness.  Neurological:     Mental Status: He is alert.      UC Treatments / Results  Labs (all labs ordered are listed, but only abnormal results are displayed) Labs Reviewed  CULTURE, GROUP A STREP ([facility])  NOVEL  CORONAVIRUS, NAA (HOSP ORDER, SEND-OUT TO REF LAB; TAT 18-24 HRS)  POC SARS CORONAVIRUS 2 AG -  ED  POCT RAPID STREP A  POC SARS CORONAVIRUS 2 AG    EKG   Radiology No results found.  Procedures Procedures (including critical care time)  Medications Ordered in UC Medications - No data to display  Initial Impression / Assessment and Plan / UC Course  I have reviewed the triage vital signs and the nursing notes.  Pertinent labs & imaging results that were available during my care of the patient were reviewed by me and considered in my medical decision making (see chart for details).    Patient is discharge in stable condition. COVID test was send out and we will call if result is positive. Patient was advised to return if symptom get worse or to follow up with PCP Final Clinical Impressions(s) / UC Diagnoses   Final diagnoses:  SOB (shortness of breath)  Suspected COVID-19 virus infection     Discharge Instructions     Rapid COVID and Strep test are negative  COVID testing ordered.  It will take between 2-7 days for test results.  Someone will contact you regarding abnormal results.    In the meantime: You should remain isolated in your home for 10 days from symptom onset AND greater than 72 hours after symptoms resolution (absence of fever without the use of fever-reducing medication and improvement in respiratory symptoms), whichever is longer Pro-AIR for Shortness of breathGet plenty of rest and push fluids Zyrtec-D prescribed for nasal congestion, runny nose, and/or sore throat Use medications daily for symptom relief Use OTC medications like ibuprofen or tylenol as needed fever or pain Call or go to the ED if you have any new or worsening symptoms such as fever, worsening cough, shortness of breath, chest tightness, chest pain, turning blue, changes in mental status, etc...    ED Prescriptions    Medication Sig Dispense Auth. Provider   albuterol (VENTOLIN HFA)  108 (90 Base) MCG/ACT inhaler Inhale 1-2 puffs into the lungs every 6 (six) hours as needed for wheezing or shortness of breath. 16 g Tessica Cupo S, FNP   cetirizine-pseudoephedrine (ZYRTEC-D) 5-120 MG tablet Take 1 tablet by  mouth daily. 30 tablet Kayda Allers, Darrelyn Hillock, FNP     PDMP not reviewed this encounter.   Emerson Monte, FNP 05/22/19 1958

## 2019-05-24 LAB — NOVEL CORONAVIRUS, NAA (HOSP ORDER, SEND-OUT TO REF LAB; TAT 18-24 HRS): SARS-CoV-2, NAA: NOT DETECTED

## 2019-05-25 LAB — CULTURE, GROUP A STREP (THRC)

## 2019-07-16 DIAGNOSIS — W108XXA Fall (on) (from) other stairs and steps, initial encounter: Secondary | ICD-10-CM | POA: Diagnosis not present

## 2019-07-16 DIAGNOSIS — Z0000 Encounter for general adult medical examination without abnormal findings: Secondary | ICD-10-CM | POA: Diagnosis not present

## 2019-07-16 DIAGNOSIS — Z125 Encounter for screening for malignant neoplasm of prostate: Secondary | ICD-10-CM | POA: Diagnosis not present

## 2019-07-16 DIAGNOSIS — Z13220 Encounter for screening for lipoid disorders: Secondary | ICD-10-CM | POA: Diagnosis not present

## 2019-07-16 DIAGNOSIS — L989 Disorder of the skin and subcutaneous tissue, unspecified: Secondary | ICD-10-CM | POA: Diagnosis not present

## 2019-08-03 DIAGNOSIS — L910 Hypertrophic scar: Secondary | ICD-10-CM | POA: Diagnosis not present

## 2019-09-14 DIAGNOSIS — L663 Perifolliculitis capitis abscedens: Secondary | ICD-10-CM | POA: Diagnosis not present

## 2019-09-14 DIAGNOSIS — L910 Hypertrophic scar: Secondary | ICD-10-CM | POA: Diagnosis not present

## 2019-11-22 DIAGNOSIS — R0981 Nasal congestion: Secondary | ICD-10-CM | POA: Diagnosis not present

## 2019-11-22 DIAGNOSIS — Z20822 Contact with and (suspected) exposure to covid-19: Secondary | ICD-10-CM | POA: Diagnosis not present

## 2019-11-22 DIAGNOSIS — B9689 Other specified bacterial agents as the cause of diseases classified elsewhere: Secondary | ICD-10-CM | POA: Diagnosis not present

## 2019-11-22 DIAGNOSIS — J019 Acute sinusitis, unspecified: Secondary | ICD-10-CM | POA: Diagnosis not present

## 2019-11-27 DIAGNOSIS — B349 Viral infection, unspecified: Secondary | ICD-10-CM | POA: Diagnosis not present

## 2020-06-24 DIAGNOSIS — L700 Acne vulgaris: Secondary | ICD-10-CM | POA: Diagnosis not present

## 2020-06-24 DIAGNOSIS — L910 Hypertrophic scar: Secondary | ICD-10-CM | POA: Diagnosis not present

## 2020-07-21 DIAGNOSIS — Z0000 Encounter for general adult medical examination without abnormal findings: Secondary | ICD-10-CM | POA: Diagnosis not present

## 2020-07-21 DIAGNOSIS — E7800 Pure hypercholesterolemia, unspecified: Secondary | ICD-10-CM | POA: Diagnosis not present

## 2020-07-21 DIAGNOSIS — Z125 Encounter for screening for malignant neoplasm of prostate: Secondary | ICD-10-CM | POA: Diagnosis not present

## 2020-07-25 DIAGNOSIS — Z6834 Body mass index (BMI) 34.0-34.9, adult: Secondary | ICD-10-CM | POA: Diagnosis not present

## 2020-07-25 DIAGNOSIS — G4733 Obstructive sleep apnea (adult) (pediatric): Secondary | ICD-10-CM | POA: Diagnosis not present

## 2021-03-09 DIAGNOSIS — U071 COVID-19: Secondary | ICD-10-CM | POA: Diagnosis not present

## 2021-04-09 DIAGNOSIS — L910 Hypertrophic scar: Secondary | ICD-10-CM | POA: Diagnosis not present

## 2021-05-07 DIAGNOSIS — Z23 Encounter for immunization: Secondary | ICD-10-CM | POA: Diagnosis not present

## 2021-05-07 DIAGNOSIS — R002 Palpitations: Secondary | ICD-10-CM | POA: Diagnosis not present

## 2021-05-07 DIAGNOSIS — M545 Low back pain, unspecified: Secondary | ICD-10-CM | POA: Diagnosis not present

## 2021-07-30 DIAGNOSIS — Z125 Encounter for screening for malignant neoplasm of prostate: Secondary | ICD-10-CM | POA: Diagnosis not present

## 2021-07-30 DIAGNOSIS — E7800 Pure hypercholesterolemia, unspecified: Secondary | ICD-10-CM | POA: Diagnosis not present

## 2021-07-30 DIAGNOSIS — G4733 Obstructive sleep apnea (adult) (pediatric): Secondary | ICD-10-CM | POA: Diagnosis not present

## 2021-07-30 DIAGNOSIS — Z0000 Encounter for general adult medical examination without abnormal findings: Secondary | ICD-10-CM | POA: Diagnosis not present

## 2021-07-30 DIAGNOSIS — M545 Low back pain, unspecified: Secondary | ICD-10-CM | POA: Diagnosis not present

## 2021-07-30 DIAGNOSIS — N529 Male erectile dysfunction, unspecified: Secondary | ICD-10-CM | POA: Diagnosis not present

## 2021-08-18 ENCOUNTER — Ambulatory Visit
Admission: EM | Admit: 2021-08-18 | Discharge: 2021-08-18 | Disposition: A | Discharge status: Home / Self Care | Payer: BC Managed Care – PPO

## 2021-08-18 ENCOUNTER — Encounter: Payer: Self-pay | Admitting: Emergency Medicine

## 2021-08-18 ENCOUNTER — Other Ambulatory Visit: Payer: Self-pay

## 2021-08-18 DIAGNOSIS — J069 Acute upper respiratory infection, unspecified: Secondary | ICD-10-CM | POA: Diagnosis not present

## 2021-08-18 NOTE — ED Triage Notes (Signed)
Patient c/o non-productive cough, congestion, sore throat, watery eyes x 4 days.  At home COVID test was negative.  Nyquil, Dayquil w/o much relief.

## 2021-08-18 NOTE — ED Provider Notes (Signed)
[name]    CSN: [id number] Arrival date & time: 08/18/21  0851      History   Chief Complaint Chief Complaint  Patient presents with   Cough   Sore Throat    HPI Kyri Carpio. is a 51 y.o. male.   Patient here today for evaluation of dry cough, congestion, sore throat that started 4 days ago. He did take covid test at home that was negative. He has tried multiple OTC meds without significant relief. He denies nausea, vomiting or diarrhea.   The history is provided by the patient.   Past Medical History:  Diagnosis Date   Sickle cell trait [facility])     Patient Active Problem List   Diagnosis Date Noted   Atypical chest pain 05/06/2017    Past Surgical History:  Procedure Laterality Date   COLONOSCOPY     WISDOM TOOTH EXTRACTION         Home Medications    Prior to Admission medications   Medication Sig Start Date End Date Taking? Authorizing Provider  cyclobenzaprine (FLEXERIL) 10 MG tablet Take by mouth. 05/09/21  Yes [provider]  meloxicam (MOBIC) 15 MG tablet Take 15 mg by mouth daily. 06/08/21  Yes [provider]  albuterol (VENTOLIN HFA) 108 (90 Base) MCG/ACT inhaler Inhale 1-2 puffs into the lungs every 6 (six) hours as needed for wheezing or shortness of breath. 05/22/19   Avegno, Zachery Dakins, FNP  cetirizine-pseudoephedrine (ZYRTEC-D) 5-120 MG tablet Take 1 tablet by mouth daily. 05/22/19   AvegnoZachery Dakins, FNP    Family History Family History  Problem Relation Age of Onset   Heart Problems Mother    Colon cancer Father    Heart attack Neg Hx    Kidney disease Neg Hx    Sudden death Neg Hx    Stroke Neg Hx     Social History Social History   Tobacco Use   Smoking status: Never   Smokeless tobacco: Never  Vaping Use   Vaping Use: Never used  Substance Use Topics   Alcohol use: Yes    Comment: occasionally    Drug use: No     Allergies   Patient has no known allergies.   Review of  Systems Review of Systems  Constitutional:  Negative for chills and fever.  HENT:  Positive for congestion and sore throat. Negative for ear pain.   Eyes:  Negative for discharge and redness.  Respiratory:  Positive for cough. Negative for shortness of breath.   Gastrointestinal:  Negative for abdominal pain, diarrhea, nausea and vomiting.    Physical Exam Triage Vital Signs ED Triage Vitals  Enc Vitals Group     BP      Pulse      Resp      Temp      Temp src      SpO2      Weight      Height      Head Circumference      Peak Flow      Pain Score      Pain Loc      Pain Edu?      Excl. in GC?    No data found.  Updated Vital Signs BP 134/87 (BP Location: Left Arm)    Pulse 84    Temp 98.8 F (37.1 C) (Oral)    Ht 6\' 1"  (1.854 m)    Wt 250 lb (113.4 kg)  SpO2 98%    BMI 32.98 kg/m       Physical Exam Vitals and nursing note reviewed.  Constitutional:      General: He is not in acute distress.    Appearance: Normal appearance. He is not ill-appearing.  HENT:     Head: Normocephalic and atraumatic.     Right Ear: Tympanic membrane normal.     Left Ear: Tympanic membrane normal.     Nose: Congestion present.     Mouth/Throat:     Mouth: Mucous membranes are moist.     Pharynx: Oropharynx is clear. No oropharyngeal exudate or posterior oropharyngeal erythema.  Eyes:     Conjunctiva/sclera: Conjunctivae normal.  Cardiovascular:     Rate and Rhythm: Normal rate and regular rhythm.     Heart sounds: Normal heart sounds. No murmur heard. Pulmonary:     Effort: Pulmonary effort is normal. No respiratory distress.     Breath sounds: Normal breath sounds. No wheezing, rhonchi or rales.  Skin:    General: Skin is warm and dry.  Neurological:     Mental Status: He is alert.  Psychiatric:        Mood and Affect: Mood normal.        Thought Content: Thought content normal.     UC Treatments / Results  Labs (all labs ordered are listed, but only abnormal  results are displayed) Labs Reviewed  COVID-19, FLU A+B NAA    EKG   Radiology No results found.  Procedures Procedures (including critical care time)  Medications Ordered in UC Medications - No data to display  Initial Impression / Assessment and Plan / UC Course  I have reviewed the triage vital signs and the nursing notes.  Pertinent labs & imaging results that were available during my care of the patient were reviewed by me and considered in my medical decision making (see chart for details).    Suspect viral etiology of symptoms. Will order covid and flu screening. Encouraged symptomatic treatment and follow up if symptoms do not improve or worsen.   Final Clinical Impressions(s) / UC Diagnoses   Final diagnoses:  Acute upper respiratory infection   Discharge Instructions   None    ED Prescriptions   None    PDMP not reviewed this encounter.   Francene Finders, PA-C 08/18/21 1205

## 2021-08-19 LAB — COVID-19, FLU A+B NAA
Influenza A, NAA: NOT DETECTED
Influenza B, NAA: NOT DETECTED
SARS-CoV-2, NAA: NOT DETECTED

## 2021-08-26 DIAGNOSIS — M545 Low back pain, unspecified: Secondary | ICD-10-CM | POA: Diagnosis not present

## 2021-08-28 ENCOUNTER — Other Ambulatory Visit: Payer: Self-pay | Admitting: Sports Medicine

## 2021-08-28 ENCOUNTER — Other Ambulatory Visit: Payer: Self-pay

## 2021-08-28 ENCOUNTER — Ambulatory Visit
Admission: RE | Admit: 2021-08-28 | Discharge: 2021-08-28 | Disposition: A | Discharge status: Home / Self Care | Payer: BC Managed Care – PPO | Source: Ambulatory Visit | Attending: Sports Medicine | Admitting: Sports Medicine

## 2021-08-28 DIAGNOSIS — M545 Low back pain, unspecified: Secondary | ICD-10-CM

## 2021-09-23 DIAGNOSIS — M898X1 Other specified disorders of bone, shoulder: Secondary | ICD-10-CM | POA: Diagnosis not present

## 2021-09-23 DIAGNOSIS — M545 Low back pain, unspecified: Secondary | ICD-10-CM | POA: Diagnosis not present

## 2022-02-25 DIAGNOSIS — R0789 Other chest pain: Secondary | ICD-10-CM | POA: Diagnosis not present

## 2022-03-24 NOTE — Progress Notes (Unsigned)
Cardiology Office Note   Date:  03/24/2022   ID:  Elijah Brown., DOB [birth year], MRN [id number]  PCP:  Elijah Brunette, MD  Cardiologist:   None Referring:  ***  No chief complaint on file.     History of Present Illness: Elijah Brown. is a 51 y.o. male who presents for evaluation of chest pain.  ***    Echo in 2018 demonstrated NL EF and no valvular abnormalities.   He was seen at that time b Dr. Allyson Sabal.  POET (Plain Old Exercise Treadmill) was negative and  calcium score was zero.  ***   Past Medical History:  Diagnosis Date   Sickle cell trait (HCC)     Past Surgical History:  Procedure Laterality Date   COLONOSCOPY     WISDOM TOOTH EXTRACTION       Current Outpatient Medications  Medication Sig Dispense Refill   albuterol (VENTOLIN HFA) 108 (90 Base) MCG/ACT inhaler Inhale 1-2 puffs into the lungs every 6 (six) hours as needed for wheezing or shortness of breath. 16 g 0   cetirizine-pseudoephedrine (ZYRTEC-D) 5-120 MG tablet Take 1 tablet by mouth daily. 30 tablet 0   cyclobenzaprine (FLEXERIL) 10 MG tablet Take by mouth.     meloxicam (MOBIC) 15 MG tablet Take 15 mg by mouth daily.     No current facility-administered medications for this visit.    Allergies:   Patient has no known allergies.    Social History:  The patient  reports that he has never smoked. He has never used smokeless tobacco. He reports current alcohol use. He reports that he does not use drugs.   Family History:  The patient's ***family history includes Colon cancer in his father; Heart Problems in his mother.    ROS:  Please see the history of present illness.   Otherwise, review of systems are positive for {NONE DEFAULTED:18576}.   All other systems are reviewed and negative.    PHYSICAL EXAM: VS:  There were no vitals taken for this visit. , BMI There is no height or weight on file to calculate BMI. GENERAL:  Well appearing HEENT:  Pupils equal round and reactive, fundi  not visualized, oral mucosa unremarkable NECK:  No jugular venous distention, waveform within normal limits, carotid upstroke brisk and symmetric, no bruits, no thyromegaly LYMPHATICS:  No cervical, inguinal adenopathy LUNGS:  Clear to auscultation bilaterally BACK:  No CVA tenderness CHEST:  Unremarkable HEART:  PMI not displaced or sustained,S1 and S2 within normal limits, no S3, no S4, no clicks, no rubs, *** murmurs ABD:  Flat, positive bowel sounds normal in frequency in pitch, no bruits, no rebound, no guarding, no midline pulsatile mass, no hepatomegaly, no splenomegaly EXT:  2 plus pulses throughout, no edema, no cyanosis no clubbing SKIN:  No rashes no nodules NEURO:  Cranial nerves II through XII grossly intact, motor grossly intact throughout PSYCH:  Cognitively intact, oriented to person place and time    EKG:  EKG {ACTION; IS/IS SNK:[id number]} ordered today. The ekg ordered today demonstrates ***   Recent Labs: No results found for requested labs within last 365 days.    Lipid Panel No results found for: "CHOL", "TRIG", "HDL", "CHOLHDL", "VLDL", "LDLCALC", "LDLDIRECT"    Wt Readings from Last 3 Encounters:  08/18/21 250 lb (113.4 kg)  05/22/19 250 lb (113.4 kg)  05/06/17 248 lb 3.2 oz (112.6 kg)      Other studies Reviewed: Additional studies/ records that were reviewed  today include: ***. Review of the above records demonstrates:  Please see elsewhere in the note.  ***   ASSESSMENT AND PLAN:  Chest pain:  ***   Current medicines are reviewed at length with the patient today.  The patient {ACTIONS; HAS/DOES NOT HAVE:19233} concerns regarding medicines.  The following changes have been made:  {PLAN; NO CHANGE:13088:s}  Labs/ tests ordered today include: *** No orders of the defined types were placed in this encounter.    Disposition:   FU with ***    Signed, Minus Breeding, MD  03/24/2022 8:05 PM    [place]

## 2022-03-25 ENCOUNTER — Encounter: Payer: Self-pay | Admitting: Cardiology

## 2022-03-25 ENCOUNTER — Ambulatory Visit: Payer: BC Managed Care – PPO | Attending: Cardiology | Admitting: Cardiology

## 2022-03-25 VITALS — BP 130/90 | HR 71 | Ht 73.0 in | Wt 251.8 lb

## 2022-03-25 DIAGNOSIS — R072 Precordial pain: Secondary | ICD-10-CM | POA: Diagnosis not present

## 2022-03-25 DIAGNOSIS — R002 Palpitations: Secondary | ICD-10-CM | POA: Diagnosis not present

## 2022-03-25 NOTE — Patient Instructions (Signed)
  Testing/Procedures:  CORONARY CALCIUM SCORING CT SCAN AT THE [facility]   Follow-Up: At [facility], you and your health needs are our priority.  As part of our continuing mission to provide you with exceptional heart care, we have created designated Provider Care Teams.  These Care Teams include your primary Cardiologist (physician) and Advanced Practice Providers (APPs -  Physician Assistants and Nurse Practitioners) who all work together to provide you with the care you need, when you need it.  We recommend signing up for the patient portal called "MyChart".  Sign up information is provided on this After Visit Summary.  MyChart is used to connect with patients for Virtual Visits (Telemedicine).  Patients are able to view lab/test results, encounter notes, upcoming appointments, etc.  Non-urgent messages can be sent to your provider as well.   To learn more about what you can do with MyChart, go to [email or web address].    Your next appointment:   6 week(s)  The format for your next appointment:   In Person  Provider:    HERDEYCX-KGY OR NP

## 2022-04-04 ENCOUNTER — Ambulatory Visit: Payer: BC Managed Care – PPO | Attending: Cardiology

## 2022-04-04 DIAGNOSIS — R002 Palpitations: Secondary | ICD-10-CM | POA: Diagnosis not present

## 2022-04-05 DIAGNOSIS — R002 Palpitations: Secondary | ICD-10-CM | POA: Diagnosis not present

## 2022-05-10 ENCOUNTER — Ambulatory Visit: Payer: BC Managed Care – PPO | Admitting: Nurse Practitioner

## 2022-05-10 ENCOUNTER — Ambulatory Visit (HOSPITAL_BASED_OUTPATIENT_CLINIC_OR_DEPARTMENT_OTHER)
Admission: RE | Admit: 2022-05-10 | Discharge: 2022-05-10 | Disposition: A | Discharge status: Home / Self Care | Payer: BC Managed Care – PPO | Source: Ambulatory Visit | Attending: Cardiology | Admitting: Cardiology

## 2022-05-10 DIAGNOSIS — R002 Palpitations: Secondary | ICD-10-CM | POA: Insufficient documentation

## 2022-05-11 ENCOUNTER — Ambulatory Visit: Payer: BC Managed Care – PPO | Attending: Nurse Practitioner | Admitting: Nurse Practitioner

## 2022-05-11 ENCOUNTER — Encounter: Payer: Self-pay | Admitting: Nurse Practitioner

## 2022-05-11 VITALS — BP 134/87 | HR 71 | Wt 254.6 lb

## 2022-05-11 DIAGNOSIS — R072 Precordial pain: Secondary | ICD-10-CM | POA: Diagnosis not present

## 2022-05-11 DIAGNOSIS — E782 Mixed hyperlipidemia: Secondary | ICD-10-CM

## 2022-05-11 DIAGNOSIS — Z87898 Personal history of other specified conditions: Secondary | ICD-10-CM | POA: Diagnosis not present

## 2022-05-11 DIAGNOSIS — R002 Palpitations: Secondary | ICD-10-CM

## 2022-05-11 NOTE — Patient Instructions (Addendum)
Medication Instructions:  Your physician recommends that you continue on your current medications as directed. Please refer to the Current Medication list given to you today.   *If you need a refill on your cardiac medications before your next appointment, please call your pharmacy*   Lab Work: NONE ordered at this time of appointment   If you have labs (blood work) drawn today and your tests are completely normal, you will receive your results only by: MyChart Message (if you have MyChart) OR A paper copy in the mail If you have any lab test that is abnormal or we need to change your treatment, we will call you to review the results.   Testing/Procedures: Your physician has recommended that you have a sleep study. This test records several body functions during sleep, including: brain activity, eye movement, oxygen and carbon dioxide blood levels, heart rate and rhythm, breathing rate and rhythm, the flow of air through your mouth and nose, snoring, body muscle movements, and chest and belly movement.    Follow-Up: At [facility], you and your health needs are our priority.  As part of our continuing mission to provide you with exceptional heart care, we have created designated Provider Care Teams.  These Care Teams include your primary Cardiologist (physician) and Advanced Practice Providers (APPs -  Physician Assistants and Nurse Practitioners) who all work together to provide you with the care you need, when you need it.  We recommend signing up for the patient portal called "MyChart".  Sign up information is provided on this After Visit Summary.  MyChart is used to connect with patients for Virtual Visits (Telemedicine).  Patients are able to view lab/test results, encounter notes, upcoming appointments, etc.  Non-urgent messages can be sent to your provider as well.   To learn more about what you can do with MyChart, go to [email or web address].    Your next appointment:    4-6 month(s)  The format for your next appointment:   In Person  Provider:   Dr. Antoine Poche      Other Instructions   Important Information About Sugar

## 2022-05-11 NOTE — Progress Notes (Signed)
Office Visit    Patient Name: Elijah Brown. Date of Encounter: 05/11/2022  Primary Care Provider:  Carol Ada, MD Primary Cardiologist:  Minus Breeding, MD  Chief Complaint    51 year old male with a history of chest pain, palpitations, and dyslipidemia who presents for follow-up related to chest pain.  Past Medical History    Past Medical History:  Diagnosis Date   Dyslipidemia    Sickle cell trait ([place])    Past Surgical History:  Procedure Laterality Date   COLONOSCOPY     WISDOM TOOTH EXTRACTION      Allergies  No Known Allergies  History of Present Illness    51 year old male with the above past medical history including chest pain, palpitations, and dyslipidemia.  He was previously evaluated by Dr. Gwenlyn Found in 2018.  Prior coronary calcium score in 2013 was 0.  Echocardiogram in 2018 showed normal EF, no valvular abnormalities.  ETT was negative at the time.  He was again referred to cardiology (Dr. Percival Spanish) by his PCP.  He was last seen in the office on 03/25/2022 and noted atypical chest discomfort, and rare palpitations.  Outpatient monitor showed normal sinus rhythm, sinus tachycardia, no sustained arrhythmia.  Repeat coronary calcium score was 0.  If work-up is unrevealing, it was noted he may benefit from a sleep study.  He presents today for follow-up.  Since his last visit been stable from a cardiac standpoint.  He continues to note intermittent episodes that he describes as a "jolting sensation" that wake him from his sleep.  He denies any other palpitations.  He does note an occasional brief squeezing sensation in his chest, denies symptoms concerning for angina.   Home Medications    No current outpatient medications on file.   No current facility-administered medications for this visit.     Review of Systems    He denies chest pain, dyspnea, pnd, orthopnea, n, v, dizziness, syncope, edema, weight gain, or early satiety. All other systems  reviewed and are otherwise negative except as noted above.  Physical Exam    VS:  BP 134/87   Pulse 71   Wt 254 lb 9.6 oz (115.5 kg)   BMI 33.59 kg/m  , BMI Body mass index is 33.59 kg/m. STOP-Bang Score:  6      GEN: Well nourished, well developed, in no acute distress. HEENT: normal. Neck: Supple, no JVD, carotid bruits, or masses. Cardiac: RRR, no murmurs, rubs, or gallops. No clubbing, cyanosis, edema.  Radials/DP/PT 2+ and equal bilaterally.  Respiratory:  Respirations regular and unlabored, clear to auscultation bilaterally. GI: Soft, nontender, nondistended, BS + x 4. MS: no deformity or atrophy. Skin: warm and dry, no rash. Neuro:  Strength and sensation are intact. Psych: Normal affect.  Accessory Clinical Findings    ECG personally reviewed by me today - No EKG in office today.    Lab Results  Component Value Date   WBC 7.4 03/11/2015   HGB 15.5 03/11/2015   HCT 44.4 03/11/2015   MCV 87.9 03/11/2015   PLT  03/11/2015    PLATELET CLUMPS NOTED ON SMEAR, COUNT APPEARS ADEQUATE   Lab Results  Component Value Date   CREATININE 1.14 03/11/2015   BUN 10 03/11/2015   NA 137 03/11/2015   K 4.3 03/11/2015   CL 104 03/11/2015   CO2 25 03/11/2015   No results found for: "ALT", "AST", "GGT", "ALKPHOS", "BILITOT" No results found for: "CHOL", "HDL", "LDLCALC", "LDLDIRECT", "TRIG", "CHOLHDL"  No results  found for: "HGBA1C"  Assessment & Plan    1. Atypical chest pain: Prior calcium score of 0, ETT negative in 2018.  Recent repeat coronary calcium score of 0.  Continues to note intermittent episodes of fleeting squeezing sensation in his chest.  He denies any exertional symptoms.  Work-up to date reassuring for noncardiac chest pain.  2. Palpitations: He notes ongoing intermittent episodes that occur at night which she describes as a "jolting sensation" that wakes him from his sleep.  He denies any other palpitations. Recent cardiac monitor showed normal sinus rhythm,  sinus tachycardia, no sustained arrhythmia.  I suspect his symptoms could be related to possible sleep apnea, sleep study pending as below.    3. Dyslipidemia: LDL was 127 in 07/2021.  Monitored and managed per PCP.  Given recent coronary calcium score of 0, recommend ongoing lifestyle modifications with diet and exercise.   4. H/o snoring: He notes a longstanding history of snoring.  He states his wife has observed him stop breathing at night.  His symptoms that he describes are suspicious for notes of apnea.  Epworth scale equals 10, STOP-BANG equals 6.  Will order home sleep study to evaluate for OSA.  5. Disposition: Follow-up in 4-6 months.      Joylene Grapes, NP 05/11/2022, 12:07 PM

## 2022-05-12 ENCOUNTER — Telehealth: Payer: Self-pay | Admitting: *Deleted

## 2022-05-12 NOTE — Telephone Encounter (Signed)
Notified Elijah Brown ok to activate itamar device. Per [employer] no PA is required. Call reference # [id number].

## 2022-05-17 ENCOUNTER — Encounter: Payer: Self-pay | Admitting: *Deleted

## 2022-05-19 ENCOUNTER — Telehealth: Payer: Self-pay

## 2022-05-19 NOTE — Telephone Encounter (Signed)
I CALLED THE PATIENT TO INFORM HIM ABOUT HIS ITAMAR DEVICE. UNABLE TO REACH THE PATIENT, LVM FOR HIM TO RETURN MY CALL

## 2022-05-25 ENCOUNTER — Telehealth: Payer: Self-pay

## 2022-05-25 NOTE — Telephone Encounter (Signed)
Called and made the patient aware that HE may proceed with the Itamar Home Sleep Study. PIN # provided to the patient. Patient made aware that HE will be contacted after the test has been read with the results and any recommendations. Patient verbalized understanding and thanked me for the call.   

## 2022-07-22 DIAGNOSIS — L910 Hypertrophic scar: Secondary | ICD-10-CM | POA: Diagnosis not present

## 2022-08-30 DIAGNOSIS — Z23 Encounter for immunization: Secondary | ICD-10-CM | POA: Diagnosis not present

## 2022-08-30 DIAGNOSIS — N529 Male erectile dysfunction, unspecified: Secondary | ICD-10-CM | POA: Diagnosis not present

## 2022-08-30 DIAGNOSIS — Z1211 Encounter for screening for malignant neoplasm of colon: Secondary | ICD-10-CM | POA: Diagnosis not present

## 2022-08-30 DIAGNOSIS — E7800 Pure hypercholesterolemia, unspecified: Secondary | ICD-10-CM | POA: Diagnosis not present

## 2022-08-30 DIAGNOSIS — Z0000 Encounter for general adult medical examination without abnormal findings: Secondary | ICD-10-CM | POA: Diagnosis not present

## 2022-08-30 DIAGNOSIS — G4700 Insomnia, unspecified: Secondary | ICD-10-CM | POA: Diagnosis not present

## 2022-09-07 ENCOUNTER — Telehealth: Payer: Self-pay | Admitting: Cardiology

## 2022-09-07 NOTE — Telephone Encounter (Signed)
Pt would like a call back regarding sleep study he was doing back in November. He'd lost the monitor and then was able to find it again but he wants to know what's the steps for him to get it back started, if he needs to get it back started and does he need to do it before his appt on 3/22 or should he push his office visit out first to take care of this. Please advise.

## 2022-09-07 NOTE — Telephone Encounter (Signed)
Called pt. He states the monitor was misplaced due to his mother moving in and things being moved. He wants to know if he should still do the sleep study. He has the monitor and the pin number. He wants to know should he move his appt back until he does the sleep study.

## 2022-09-08 DIAGNOSIS — E785 Hyperlipidemia, unspecified: Secondary | ICD-10-CM | POA: Insufficient documentation

## 2022-09-08 NOTE — Progress Notes (Deleted)
Cardiology Office Note   Date:  09/08/2022   ID:  Elijah Brown., DOB 01-23-1971, MRN [id number]  PCP:  Carol Ada, MD  Cardiologist:   Minus Breeding, MD Referring:  Carol Ada, MD  No chief complaint on file.     History of Present Illness: Elijah Brown. is a 52 y.o. male who presents for evaluation of chest pain.  He is referred by Carol Ada, MD    Echo in 2018 demonstrated NL EF and no valvular abnormalities.   He was seen at that time Dr. Gwenlyn Found.  POET (Plain Old Exercise Treadmill) was negative and  calcium score was zero with normal coronaries on CT in 2013 and Nov 2023. ***  ***He presents with episodes of a "jolting" sensation that wakes him from his sleep.  This is happened 4-5 times of the past year.  He does not describe sustained tachyarrhythmias.  He does not have it during the day.  He denies any presyncope or syncope.  He does feel get some chest discomfort rest.  He does not get this with nursing.  He has not had any PND or orthopnea.  He has not had any neck or arm discomfort.  He has had no weight gain or edema.  Past Medical History:  Diagnosis Date   Dyslipidemia    Sickle cell trait ([place])     Past Surgical History:  Procedure Laterality Date   COLONOSCOPY     WISDOM TOOTH EXTRACTION       No current outpatient medications on file.   No current facility-administered medications for this visit.    Allergies:   Patient has no known allergies.    ROS:  Please see the history of present illness.   Otherwise, review of systems are positive for ***.   All other systems are reviewed and negative.    PHYSICAL EXAM: VS:  There were no vitals taken for this visit. , BMI There is no height or weight on file to calculate BMI. GENERAL:  Well appearing NECK:  No jugular venous distention, waveform within normal limits, carotid upstroke brisk and symmetric, no bruits, no thyromegaly LUNGS:  Clear to auscultation bilaterally CHEST:   Unremarkable HEART:  PMI not displaced or sustained,S1 and S2 within normal limits, no S3, no S4, no clicks, no rubs, *** murmurs ABD:  Flat, positive bowel sounds normal in frequency in pitch, no bruits, no rebound, no guarding, no midline pulsatile mass, no hepatomegaly, no splenomegaly EXT:  2 plus pulses throughout, no edema, no cyanosis no clubbing    ***GENERAL:  Well appearing HEENT:  Pupils equal round and reactive, fundi not visualized, oral mucosa unremarkable NECK:  No jugular venous distention, waveform within normal limits, carotid upstroke brisk and symmetric, no bruits, no thyromegaly LYMPHATICS:  No cervical, inguinal adenopathy LUNGS:  Clear to auscultation bilaterally BACK:  No CVA tenderness CHEST:  Unremarkable HEART:  PMI not displaced or sustained,S1 and S2 within normal limits, no S3, no S4, no clicks, no rubs, no murmurs ABD:  Flat, positive bowel sounds normal in frequency in pitch, no bruits, no rebound, no guarding, no midline pulsatile mass, no hepatomegaly, no splenomegaly EXT:  2 plus pulses throughout, no edema, no cyanosis no clubbing SKIN:  No rashes no nodules NEURO:  Cranial nerves II through XII grossly intact, motor grossly intact throughout PSYCH:  Cognitively intact, oriented to person place and time    EKG:  EKG is *** ordered today. The ekg  ordered today demonstrates sinus rhythm, rate ***, axis within normal limits, old within normal limits, no acute ST-T wave changes.   Recent Labs: No results found for requested labs within last 365 days.    Lipid Panel No results found for: "CHOL", "TRIG", "HDL", "CHOLHDL", "VLDL", "LDLCALC", "LDLDIRECT"    Wt Readings from Last 3 Encounters:  05/11/22 254 lb 9.6 oz (115.5 kg)  03/25/22 251 lb 12.8 oz (114.2 kg)  08/18/21 250 lb (113.4 kg)      Other studies Reviewed: Additional studies/ records that were reviewed today include: *** Review of the above records demonstrates:  ***   ASSESSMENT  AND PLAN:  Chest pain:   Calcium score was zero.  *** e has some mild chest discomfort with emotional stress.  I am going to screen him with a coronary calcium score as he had 0 calcium and no obstructive disease in the past I think the pretest probability of obstructive coronary disease is low.  Calcium score is more for risk stratification.  Dyslipidemia: ***  urther goals of therapy will be based on the calcium score.  His LDL was 127.  Palpitations:    Monitor demonstrated NSR.  *** I am going to have him wear a 4-week event monitor.  If this is unrevealing he might need a sleep study.  He apparently had a sleep study years ago but does not recall the results.   Current medicines are reviewed at length with the patient today.  The patient does not have concerns regarding medicines.  The following changes have been made:  ***  Labs/ tests ordered today include:  ***  No orders of the defined types were placed in this encounter.    Disposition:   FU with *** weeks   Signed, Minus Breeding, MD  09/08/2022 9:17 PM    [place]

## 2022-09-10 ENCOUNTER — Ambulatory Visit: Payer: BC Managed Care – PPO | Attending: Cardiology | Admitting: Cardiology

## 2022-09-10 DIAGNOSIS — R072 Precordial pain: Secondary | ICD-10-CM

## 2022-09-10 DIAGNOSIS — R002 Palpitations: Secondary | ICD-10-CM

## 2022-09-10 DIAGNOSIS — E785 Hyperlipidemia, unspecified: Secondary | ICD-10-CM

## 2022-09-11 IMAGING — CR DG LUMBAR SPINE 2-3V
3 series · 3 of 3 positions shown · non-contrast
Comparison: None.

CLINICAL DATA: Chronic low back pain.

EXAM:
LUMBAR SPINE - 2-3 VIEW

[w lumbar spine ap]
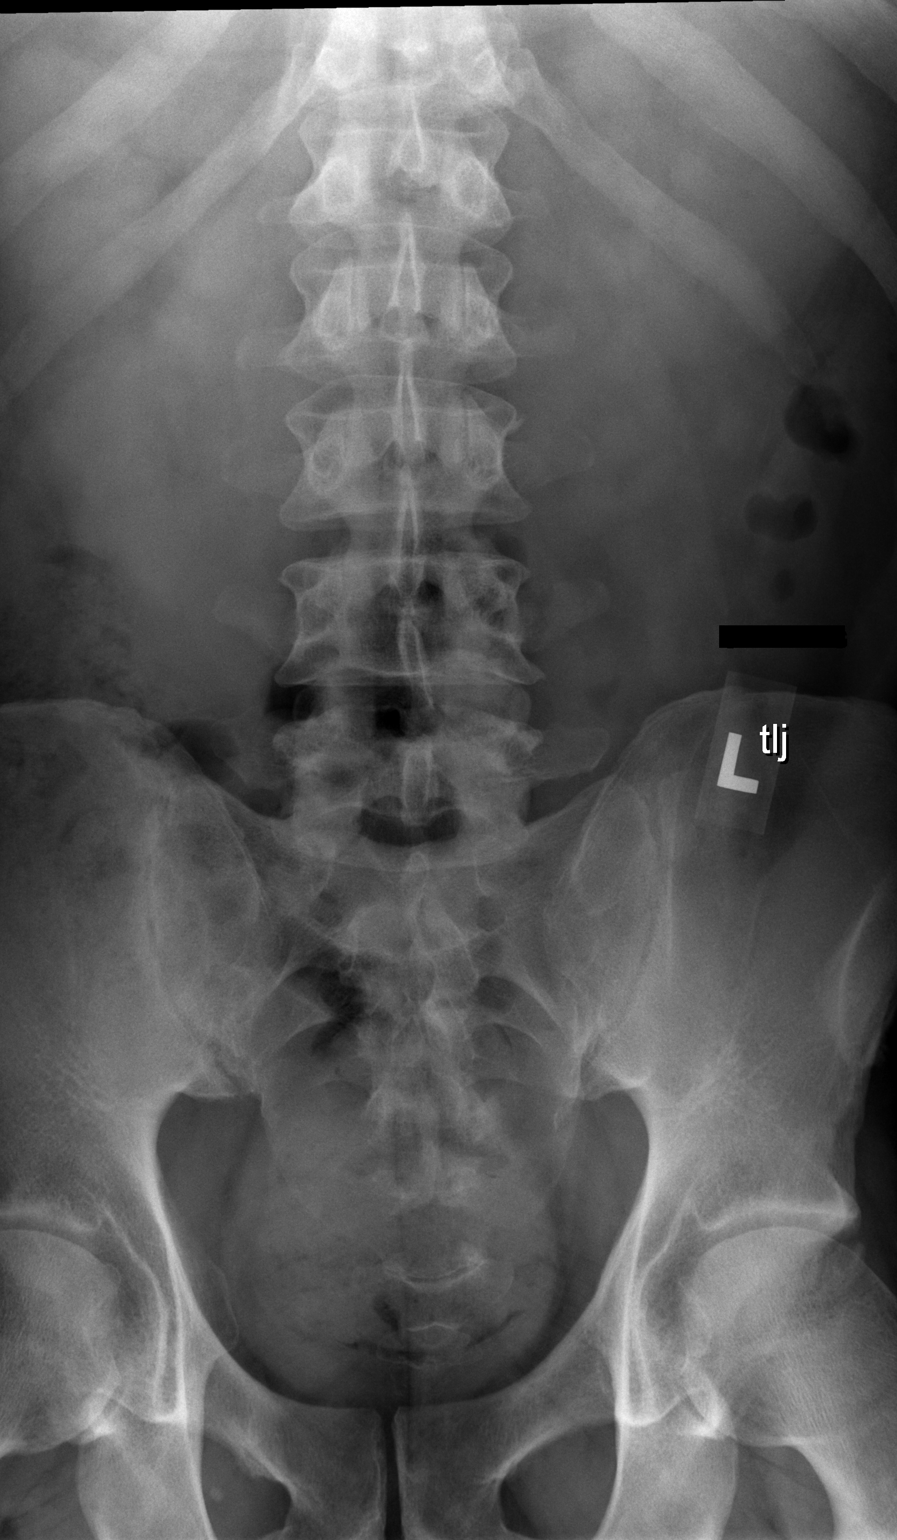

[w lumbar spine lat]
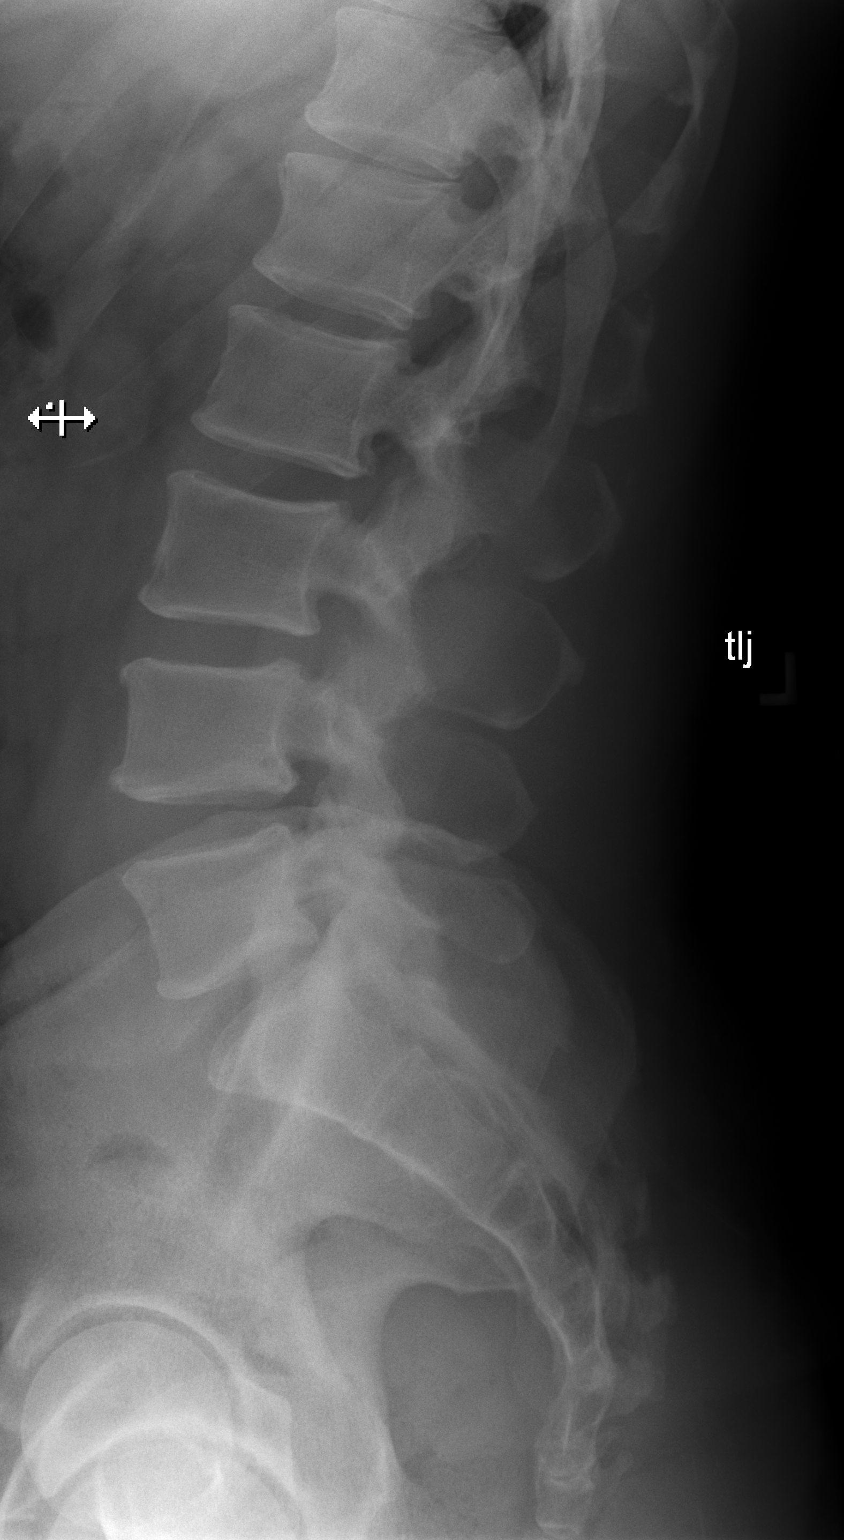

[w lumbar l-5 s-1 spot]
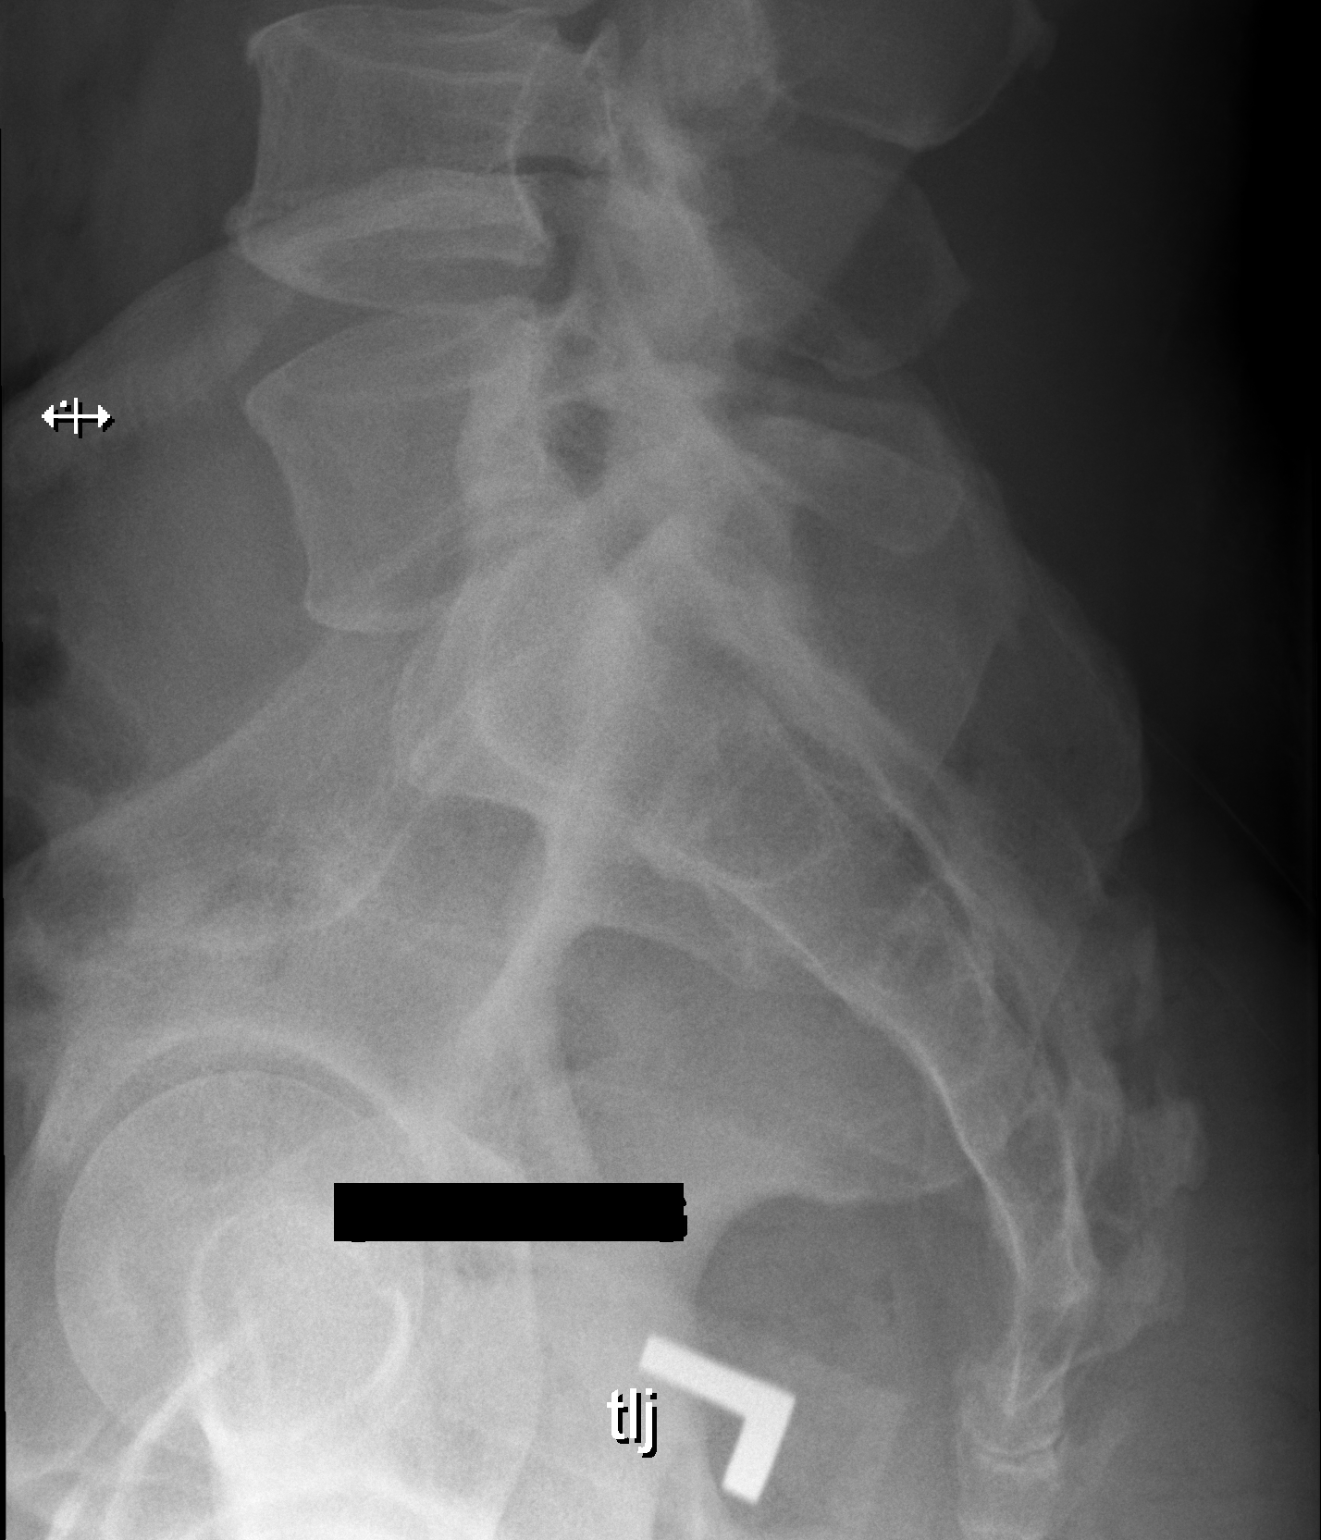

[3 of 3 positions shown; findings below may reference images not displayed]

FINDINGS: There is no evidence of lumbar spine fracture. Alignment is normal.
Intervertebral disc spaces are maintained.
IMPRESSION: Negative.

## 2022-09-21 ENCOUNTER — Telehealth: Payer: Self-pay

## 2022-09-21 NOTE — Telephone Encounter (Signed)
I spoke with the patient regarding the Itamar device. He states that he had misplaced it but now has found it again and plans to complete the study this week.

## 2022-09-29 DIAGNOSIS — M545 Low back pain, unspecified: Secondary | ICD-10-CM | POA: Diagnosis not present

## 2022-10-11 ENCOUNTER — Encounter (INDEPENDENT_AMBULATORY_CARE_PROVIDER_SITE_OTHER): Payer: BC Managed Care – PPO | Admitting: Cardiology

## 2022-10-11 ENCOUNTER — Ambulatory Visit: Payer: BC Managed Care – PPO | Attending: Nurse Practitioner

## 2022-10-11 DIAGNOSIS — G4733 Obstructive sleep apnea (adult) (pediatric): Secondary | ICD-10-CM | POA: Diagnosis not present

## 2022-10-11 NOTE — Procedures (Signed)
SLEEP STUDY REPORT Patient Information Study Date: 10/07/2022 Patient Name: Elijah Brown Patient ID: [id number] Birth Date: 1970-07-24 Age: 52 Gender: Male BMI: 33.6 (W=253 lb, H=6' 1'') Referring Physician: Bernadene Person, NP  TEST DESCRIPTION: Home sleep apnea testing was completed using the WatchPat, a Type 1 device, utilizing peripheral arterial tonometry (PAT), chest movement, actigraphy, pulse oximetry, pulse rate, body position and snore.  AHI was calculated with apnea and hypopnea using valid sleep time as the denominator. RDI includes apneas, hypopneas, and RERAs.  The data acquired and the scoring of sleep and all associated events were performed in accordance with the recommended standards and specifications as outlined in the AASM Manual for the Scoring of Sleep and Associated Events 2.2.0 (2015).  FINDINGS:  1.  Severe Obstructive Sleep Apnea with AHI 33.3/hr.   2.  Mild Central Sleep Apnea with pAHIc 11.3/hr.  3.  Oxygen desaturations as low as 81%.  4.  Severe snoring was present. O2 sats were < 88% for 6.9 min.  5.  Total sleep time was 6 hrs and 7 min.  6.  22.7% of total sleep time was spent in REM sleep.   7.  Normal sleep onset latency at 17 min.   8.  Normal REM sleep onset latency at 99 min.   9.  Total awakenings were 8.  10. Arrhythmia detection:  None.  DIAGNOSIS:   Severe Obstructive Sleep Apnea (G47.33) Nocturnal Hypoxemia  RECOMMENDATIONS:   1.  Clinical correlation of these findings is necessary.  The decision to treat obstructive sleep apnea (OSA) is usually based on the presence of apnea symptoms or the presence of associated medical conditions such as Hypertension, Congestive Heart Failure, Atrial Fibrillation or Obesity.  The most common symptoms of OSA are snoring, gasping for breath while sleeping, daytime sleepiness and fatigue.   2.  Initiating apnea therapy is recommended given the presence of symptoms and/or associated conditions.  Recommend proceeding with one of the following:     a.  Auto-CPAP therapy with a pressure range of 5-20cm H2O.     b.  An oral appliance (OA) that can be obtained from certain dentists with expertise in sleep medicine.  These are primarily of use in non-obese patients with mild and moderate disease.     c.  An ENT consultation which may be useful to look for specific causes of obstruction and possible treatment options.     d.  If patient is intolerant to PAP therapy, consider referral to ENT for evaluation for hypoglossal nerve stimulator.   3.  Close follow-up is necessary to ensure success with CPAP or oral appliance therapy for maximum benefit.  4.  A follow-up oximetry study on CPAP is recommended to assess the adequacy of therapy and determine the need for supplemental oxygen or the potential need for Bi-level therapy.  An arterial blood gas to determine the adequacy of baseline ventilation and oxygenation should also be considered.  5.  Healthy sleep recommendations include:  adequate nightly sleep (normal 7-9 hrs/night), avoidance of caffeine after noon and alcohol near bedtime, and maintaining a sleep environment that is cool, dark and quiet.  6.  Weight loss for overweight patients is recommended.  Even modest amounts of weight loss can significantly improve the severity of sleep apnea.  7.  Snoring recommendations include:  weight loss where appropriate, side sleeping, and avoidance of alcohol before bed.  8.  Operation of motor vehicle should be avoided when sleepy.  Signature:  Armanda Magic, MD; [facility]; Diplomat, American Board of Sleep Medicine Electronically Signed: 10/11/2022 2:58:11 PM

## 2022-10-12 DIAGNOSIS — M5459 Other low back pain: Secondary | ICD-10-CM | POA: Diagnosis not present

## 2022-10-21 DIAGNOSIS — M5459 Other low back pain: Secondary | ICD-10-CM | POA: Diagnosis not present

## 2022-10-27 NOTE — Telephone Encounter (Signed)
Forwarded to Romie Levee to follow up with patient.  Jim Like MHA RN CCM

## 2022-11-23 ENCOUNTER — Telehealth: Payer: Self-pay | Admitting: *Deleted

## 2022-11-23 NOTE — Telephone Encounter (Signed)
The patient has been notified of the result and verbalized understanding.  All questions (if any) were answered. Latrelle Dodrill, CMA 11/23/2022 6:28 PM    Patient will call us back after talking his results over with his wife

## 2022-11-23 NOTE — Telephone Encounter (Signed)
-----   Message from Gaynelle Cage, [place] sent at 10/13/2022 11:18 AM EDT -----  ----- Message ----- From: Quintella Reichert, MD Sent: 10/11/2022   3:00 PM EDT To: Cv Div Sleep Studies  Please let patient know that they have sleep apnea.  Recommend therapeutic CPAP titration for treatment of patient's sleep disordered breathing.  If unable to perform an in lab titration then initiate ResMed auto CPAP from 4 to 15cm H2O with heated humidity and mask of choice and overnight pulse ox on CPAP.

## 2022-11-24 ENCOUNTER — Other Ambulatory Visit: Payer: Self-pay

## 2022-11-24 DIAGNOSIS — G4733 Obstructive sleep apnea (adult) (pediatric): Secondary | ICD-10-CM

## 2022-12-06 ENCOUNTER — Telehealth: Payer: Self-pay | Admitting: Cardiology

## 2022-12-06 NOTE — Telephone Encounter (Signed)
Pt was calling in returning Nina's call about be fitted for the sleep mask. He has decided to move forward   Best number is [phone]

## 2022-12-17 NOTE — Telephone Encounter (Signed)
RETURN CALL: Patient will have new insurance in July so will wait to hear back from him in July to precert his titration.

## 2022-12-30 DIAGNOSIS — R058 Other specified cough: Secondary | ICD-10-CM | POA: Diagnosis not present

## 2022-12-30 DIAGNOSIS — H5789 Other specified disorders of eye and adnexa: Secondary | ICD-10-CM | POA: Diagnosis not present

## 2022-12-30 DIAGNOSIS — R1032 Left lower quadrant pain: Secondary | ICD-10-CM | POA: Diagnosis not present

## 2023-01-12 DIAGNOSIS — H5789 Other specified disorders of eye and adnexa: Secondary | ICD-10-CM | POA: Diagnosis not present

## 2023-01-12 DIAGNOSIS — R519 Headache, unspecified: Secondary | ICD-10-CM | POA: Diagnosis not present

## 2023-01-12 DIAGNOSIS — L42 Pityriasis rosea: Secondary | ICD-10-CM | POA: Diagnosis not present

## 2023-01-12 DIAGNOSIS — R052 Subacute cough: Secondary | ICD-10-CM | POA: Diagnosis not present

## 2023-01-12 DIAGNOSIS — H04123 Dry eye syndrome of bilateral lacrimal glands: Secondary | ICD-10-CM | POA: Diagnosis not present

## 2023-02-04 DIAGNOSIS — K648 Other hemorrhoids: Secondary | ICD-10-CM | POA: Diagnosis not present

## 2023-02-04 DIAGNOSIS — Z09 Encounter for follow-up examination after completed treatment for conditions other than malignant neoplasm: Secondary | ICD-10-CM | POA: Diagnosis not present

## 2023-02-04 DIAGNOSIS — Z86010 Personal history of colonic polyps: Secondary | ICD-10-CM | POA: Diagnosis not present

## 2023-02-04 DIAGNOSIS — K5730 Diverticulosis of large intestine without perforation or abscess without bleeding: Secondary | ICD-10-CM | POA: Diagnosis not present

## 2023-03-03 DIAGNOSIS — L910 Hypertrophic scar: Secondary | ICD-10-CM | POA: Diagnosis not present

## 2023-03-03 DIAGNOSIS — L309 Dermatitis, unspecified: Secondary | ICD-10-CM | POA: Diagnosis not present

## 2023-04-14 ENCOUNTER — Encounter: Payer: Self-pay | Admitting: Emergency Medicine

## 2023-04-14 ENCOUNTER — Ambulatory Visit
Admission: EM | Admit: 2023-04-14 | Discharge: 2023-04-14 | Disposition: A | Discharge status: Home / Self Care | Payer: BC Managed Care – PPO | Attending: Internal Medicine | Admitting: Internal Medicine

## 2023-04-14 DIAGNOSIS — J029 Acute pharyngitis, unspecified: Secondary | ICD-10-CM | POA: Insufficient documentation

## 2023-04-14 DIAGNOSIS — Z20822 Contact with and (suspected) exposure to covid-19: Secondary | ICD-10-CM | POA: Insufficient documentation

## 2023-04-14 DIAGNOSIS — J069 Acute upper respiratory infection, unspecified: Secondary | ICD-10-CM | POA: Insufficient documentation

## 2023-04-14 LAB — POCT RAPID STREP A (OFFICE): Rapid Strep A Screen: NEGATIVE

## 2023-04-14 MED ORDER — FLUTICASONE PROPIONATE 50 MCG/ACT NA SUSP: 1.0000 | Freq: Every day | NASAL | 0 refills | Status: AC

## 2023-04-14 MED ORDER — BENZONATATE 100 MG PO CAPS: 100.0000 mg | ORAL_CAPSULE | Freq: Three times a day (TID) | ORAL | 0 refills | Status: AC | PRN

## 2023-04-14 MED ORDER — CHLORASEPTIC 1.4 % MT LIQD: 1.0000 | OROMUCOSAL | 0 refills | Status: AC | PRN

## 2023-04-14 NOTE — ED Provider Notes (Addendum)
[name]    CSN: [id number] Arrival date & time: 04/14/23  0805      History   Chief Complaint Chief Complaint  Patient presents with   Sore Throat    HPI Elijah Brown. is a 52 y.o. male.   Patient presents with sore throat, nasal congestion, cough that started about 5 days ago.  Denies any fever but does report some bodyaches.  Denies any known sick contacts.  Patient has taken NyQuil, DayQuil, Robitussin with minimal improvement.  Denies history of asthma or COPD and patient denies that he smokes cigarettes.  Denies chest pain or shortness of breath.   Sore Throat    Past Medical History:  Diagnosis Date   Dyslipidemia    Sickle cell trait [facility])     Patient Active Problem List   Diagnosis Date Noted   Dyslipidemia 09/08/2022   Atypical chest pain 05/06/2017    Past Surgical History:  Procedure Laterality Date   COLONOSCOPY     WISDOM TOOTH EXTRACTION         Home Medications    Prior to Admission medications   Medication Sig Start Date End Date Taking? Authorizing Provider  benzonatate (TESSALON) 100 MG capsule Take 1 capsule (100 mg total) by mouth every 8 (eight) hours as needed for cough. 04/14/23  Yes Robbi Scurlock, Rolly Salter E, FNP  fluticasone (FLONASE) 50 MCG/ACT nasal spray Place 1 spray into both nostrils daily. 04/14/23  Yes Allegra Cerniglia, Rolly Salter E, FNP  phenol (CHLORASEPTIC) 1.4 % LIQD Use as directed 1 spray in the mouth or throat as needed for throat irritation / pain. 04/14/23  Yes Denasia Venn, Acie Fredrickson, FNP    Family History Family History  Problem Relation Age of Onset   Heart Problems Mother        Pacemaker   Colon cancer Father    Heart attack Neg Hx    Kidney disease Neg Hx    Sudden death Neg Hx    Stroke Neg Hx     Social History Social History   Tobacco Use   Smoking status: Never   Smokeless tobacco: Never  Vaping Use   Vaping status: Never Used  Substance Use Topics   Alcohol use: Yes    Comment: occasionally    Drug  use: No     Allergies   Patient has no known allergies.   Review of Systems Review of Systems Per HPI  Physical Exam Triage Vital Signs ED Triage Vitals  Encounter Vitals Group     BP 04/14/23 0831 132/88     Systolic BP Percentile --      Diastolic BP Percentile --      Pulse Rate 04/14/23 0831 82     Resp 04/14/23 0831 18     Temp 04/14/23 0831 98.5 F (36.9 C)     Temp Source 04/14/23 0831 Oral     SpO2 04/14/23 0831 96 %     Weight 04/14/23 0833 253 lb (114.8 kg)     Height 04/14/23 0833 6\' 1"  (1.854 m)     Head Circumference --      Peak Flow --      Pain Score 04/14/23 0833 5     Pain Loc --      Pain Education --      Exclude from Growth Chart --    No data found.  Updated Vital Signs BP 132/88 (BP Location: Left Arm)   Pulse 82   Temp 98.5 F (36.9  C) (Oral)   Resp 18   Ht 6\' 1"  (1.854 m)   Wt 253 lb (114.8 kg)   SpO2 96%   BMI 33.38 kg/m   Visual Acuity Right Eye Distance:   Left Eye Distance:   Bilateral Distance:    Right Eye Near:   Left Eye Near:    Bilateral Near:     Physical Exam Constitutional:      General: He is not in acute distress.    Appearance: Normal appearance. He is not toxic-appearing or diaphoretic.  HENT:     Head: Normocephalic and atraumatic.     Right Ear: Tympanic membrane and ear canal normal.     Left Ear: Tympanic membrane and ear canal normal.     Nose: Congestion present.     Mouth/Throat:     Mouth: Mucous membranes are moist.     Pharynx: Posterior oropharyngeal erythema present.  Eyes:     Extraocular Movements: Extraocular movements intact.     Conjunctiva/sclera: Conjunctivae normal.     Pupils: Pupils are equal, round, and reactive to light.  Cardiovascular:     Rate and Rhythm: Normal rate and regular rhythm.     Pulses: Normal pulses.     Heart sounds: Normal heart sounds.  Pulmonary:     Effort: Pulmonary effort is normal. No respiratory distress.     Breath sounds: No stridor. No wheezing,  rhonchi or rales.  Abdominal:     General: Abdomen is flat. Bowel sounds are normal.     Palpations: Abdomen is soft.  Musculoskeletal:        General: Normal range of motion.     Cervical back: Normal range of motion.  Skin:    General: Skin is warm and dry.  Neurological:     General: No focal deficit present.     Mental Status: He is alert and oriented to person, place, and time. Mental status is at baseline.  Psychiatric:        Mood and Affect: Mood normal.        Behavior: Behavior normal.      UC Treatments / Results  Labs (all labs ordered are listed, but only abnormal results are displayed) Labs Reviewed  SARS CORONAVIRUS 2 (TAT 6-24 HRS) - Abnormal; Notable for the following components:      Result Value   SARS Coronavirus 2 POSITIVE (*)    All other components within normal limits  CULTURE, GROUP A STREP [facility])  POCT RAPID STREP A (OFFICE)    EKG   Radiology No results found.  Procedures Procedures (including critical care time)  Medications Ordered in UC Medications - No data to display  Initial Impression / Assessment and Plan / UC Course  I have reviewed the triage vital signs and the nursing notes.  Pertinent labs & imaging results that were available during my care of the patient were reviewed by me and considered in my medical decision making (see chart for details).     Patient presents with symptoms likely from a viral upper respiratory infection. Do not suspect underlying cardiopulmonary process. Symptoms seem unlikely related to ACS, CHF or COPD exacerbations, pneumonia, pneumothorax. Patient is nontoxic appearing and not in need of emergent medical intervention. Covid test pending. Rapid strep was negative. Throat culture pending.   Recommended symptom control with medications and supportive care.  Patient was sent prescriptions to help alleviate symptoms.  Return if symptoms fail to improve in 1-2 weeks or you develop shortness of  breath,  chest pain, severe headache. Patient states understanding and is agreeable.  Discharged with PCP followup.  Final Clinical Impressions(s) / UC Diagnoses   Final diagnoses:  Encounter for laboratory testing for COVID-19 virus  Viral upper respiratory tract infection with cough  Sore throat     Discharge Instructions      Your strep test was negative. Throat culture and covid test pending. I have sent three medications to help alleviate viral symptoms. Follow up if symptoms persist or worsen.     ED Prescriptions     Medication Sig Dispense Auth. Provider   benzonatate (TESSALON) 100 MG capsule Take 1 capsule (100 mg total) by mouth every 8 (eight) hours as needed for cough. 21 capsule [place], [place] E, [place]   phenol (CHLORASEPTIC) 1.4 % LIQD Use as directed 1 spray in the mouth or throat as needed for throat irritation / pain. 118 mL Stanislawa Gaffin, Rolly Salter E, FNP   fluticasone [facility]) 50 MCG/ACT nasal spray Place 1 spray into both nostrils daily. 16 g Gustavus Bryant, [place]      PDMP not reviewed this encounter.   Gustavus Bryant, [place] 04/14/23 1005    Gustavus Bryant, [place] 04/15/23 1537

## 2023-04-14 NOTE — Discharge Instructions (Signed)
Your strep test was negative. Throat culture and covid test pending. I have sent three medications to help alleviate viral symptoms. Follow up if symptoms persist or worsen.

## 2023-04-14 NOTE — ED Triage Notes (Signed)
Patient c/o sore throat and cough x 5 days, afebrile.  Patient has some nasal drainage, denies ear pain.  Patient has taken Nyquil, Dayquil and Robitussin, cough drops.

## 2023-04-15 LAB — SARS CORONAVIRUS 2 (TAT 6-24 HRS): SARS Coronavirus 2: POSITIVE — AB

## 2023-04-17 LAB — CULTURE, GROUP A STREP (THRC)

## 2023-06-13 DIAGNOSIS — J329 Chronic sinusitis, unspecified: Secondary | ICD-10-CM | POA: Diagnosis not present

## 2023-06-13 DIAGNOSIS — R058 Other specified cough: Secondary | ICD-10-CM | POA: Diagnosis not present

## 2023-06-13 DIAGNOSIS — J40 Bronchitis, not specified as acute or chronic: Secondary | ICD-10-CM | POA: Diagnosis not present

## 2023-07-05 DIAGNOSIS — M549 Dorsalgia, unspecified: Secondary | ICD-10-CM | POA: Diagnosis not present

## 2023-07-05 DIAGNOSIS — N529 Male erectile dysfunction, unspecified: Secondary | ICD-10-CM | POA: Diagnosis not present

## 2023-07-10 ENCOUNTER — Emergency Department (HOSPITAL_COMMUNITY)
Admission: EM | Admit: 2023-07-10 | Discharge: 2023-07-10 | Disposition: A | Discharge status: Home / Self Care | Payer: BC Managed Care – PPO | Attending: Emergency Medicine | Admitting: Emergency Medicine

## 2023-07-10 ENCOUNTER — Emergency Department (HOSPITAL_COMMUNITY): Payer: BC Managed Care – PPO

## 2023-07-10 ENCOUNTER — Other Ambulatory Visit: Payer: Self-pay

## 2023-07-10 ENCOUNTER — Encounter (HOSPITAL_COMMUNITY): Payer: Self-pay

## 2023-07-10 DIAGNOSIS — M542 Cervicalgia: Secondary | ICD-10-CM | POA: Diagnosis not present

## 2023-07-10 MED ORDER — LIDOCAINE 5 % EX PTCH
1.0000 | MEDICATED_PATCH | CUTANEOUS | Status: DC
  Administered 2023-07-10: 1 via TRANSDERMAL
  Filled 2023-07-10: qty 1

## 2023-07-10 MED ORDER — LIDOCAINE 5 % EX PTCH: 1.0000 | MEDICATED_PATCH | CUTANEOUS | 0 refills | Status: AC

## 2023-07-10 MED ORDER — OXYCODONE-ACETAMINOPHEN 5-325 MG PO TABS
1.0000 | ORAL_TABLET | Freq: Once | ORAL | Status: AC
  Administered 2023-07-10: 1 via ORAL
  Filled 2023-07-10: qty 1

## 2023-07-10 NOTE — ED Triage Notes (Signed)
Pt c/o neck pain radiating into right arm with intermittent numbness and tingling for past 3 weeks. Pt states pain is worse in the mornings. Pt denies injury. Pt was seen for similar symptoms by PCP and prescribed cyclobenzaprine and prednisone w/o relief.

## 2023-07-10 NOTE — ED Provider Notes (Signed)
[place] EMERGENCY DEPARTMENT AT [facility] Provider Note   CSN: [id number] Arrival date & time: 07/10/23  0518     History  Chief Complaint  Patient presents with   Neck Pain    Elijah Brown. is a 53 y.o. male history of dyslipidemia, sickle cell trait presented for right-sided neck pain for the past 3 to 4 weeks.  Patient denies any recent trauma or injury to the head or neck area and denies any vision changes or headache or chest pain or shortness of breath, fevers, trouble swallowing, facial or neck swelling.  Patient notes that every once in a while he gets a shooting pain down his right arm but that this is intermittent without specific cause.  Patient denies any new onset weakness or dropping objects.  Patient was given prednisone and Flexeril by his primary care provider 4 days ago but states he still has some discomfort.  Patient is not been using ice or other medications at this time.  Patient denies chiropractor use or recent manipulation of the head or neck area.  Patient does go to physical therapy for his lower back.  Home Medications Prior to Admission medications   Medication Sig Start Date End Date Taking? Authorizing Provider  lidocaine (LIDODERM) 5 % Place 1 patch onto the skin daily. Remove & Discard patch within 12 hours or as directed by MD 07/10/23  Yes Taitum Menton, Beverly Gust, PA-C  benzonatate (TESSALON) 100 MG capsule Take 1 capsule (100 mg total) by mouth every 8 (eight) hours as needed for cough. 04/14/23   Gustavus Bryant, FNP  fluticasone (FLONASE) 50 MCG/ACT nasal spray Place 1 spray into both nostrils daily. 04/14/23   Mound, Acie Fredrickson, FNP  phenol (CHLORASEPTIC) 1.4 % LIQD Use as directed 1 spray in the mouth or throat as needed for throat irritation / pain. 04/14/23   Gustavus Bryant, FNP      Allergies    Patient has no known allergies.    Review of Systems   Review of Systems  Musculoskeletal:  Positive for neck pain.    Physical  Exam Updated Vital Signs BP (!) 140/103   Pulse 89   Temp 98 F (36.7 C)   Resp 18   Ht 6\' 1"  (1.854 m)   Wt 115.2 kg   SpO2 100%   BMI 33.51 kg/m  Physical Exam Vitals and nursing note reviewed.  Constitutional:      General: He is not in acute distress. Eyes:     Extraocular Movements: Extraocular movements intact.     Pupils: Pupils are equal, round, and reactive to light.  Neck:     Vascular: No carotid bruit.      Comments: No midline tenderness or abnormalities noted Tender to the right trapezius muscle with palpation without bony or muscular abnormalities Negative Spurling test bilaterally No neck rigidity No facial or neck swelling noted Cardiovascular:     Rate and Rhythm: Normal rate and regular rhythm.     Pulses: Normal pulses.     Heart sounds: Normal heart sounds.  Pulmonary:     Effort: Pulmonary effort is normal. No respiratory distress.     Breath sounds: Normal breath sounds.  Musculoskeletal:     Cervical back: Normal range of motion and neck supple. No tenderness.     Comments: Soft compartments Pain not out of proportion  Skin:    General: Skin is warm and dry.     Comments: No overlying skin  color changes  Neurological:     Mental Status: He is alert.     Comments: 5 out of 5 bilateral grip strength/elbow flexion/extension, shoulder shrug     ED Results / Procedures / Treatments   Labs (all labs ordered are listed, but only abnormal results are displayed) Labs Reviewed - No data to display  EKG None  Radiology No results found.  Procedures Procedures    Medications Ordered in ED Medications  lidocaine (LIDODERM) 5 % 1 patch (1 patch Transdermal Patch Applied 07/10/23 0731)  oxyCODONE-acetaminophen (PERCOCET/ROXICET) 5-325 MG per tablet 1 tablet (1 tablet Oral Given 07/10/23 0732)    ED Course/ Medical Decision Making/ A&P                                 Medical Decision Making Risk Prescription drug management.   Elijah Brown. 53 y.o. presented today for neck pain. Working DDx that I considered at this time includes, but not limited to, MSK, torticollis, dystonic reaction, cervical spondylosis, epidural abscess, myelitis, disc herniation, cervical radiculopathy, carotid/vertebral artery dissection, spinal cord injury, myelitis.  R/o DDx: torticollis, dystonic reaction, cervical spondylosis, epidural abscess, myelitis, disc herniation, cervical radiculopathy, carotid/vertebral artery dissection, spinal cord injury, myelitis: These are considered less likely due to history of present illness, physical exam, labs/imaging findings  Review of prior external notes: 07/05/2023 unknown  Unique Tests and My Interpretation:  C-spine x-ray: No acute findings  Social Determinants of Health: none  Discussion with Independent Historian:  Wife  Discussion of Management of Tests: None  Risk: Medium: prescription drug management  Risk Stratification Score: None  Plan: On exam patient was in no distress with stable vitals.  Patient did not endorse history of recent neck or dental surgeries or chiropractor use.  Patient had negative Spurling test but did have tenderness to his right trapezius muscle which does raise my suspicion for MSK pain similar to when patient's primary care provider saw him.  Patient is neuro vastly intact and did not have any neurologic deficits on exam.  By my independent interpretation of the cervical x-ray ordered from triage I do not see any acute findings and with no midline tenderness do not feel that patient needs CT scan at this time as I do feel this is MSK in nature.  Will give Lidoderm and Percocet for pain management while we await the official read of the x-ray but do anticipate discharge with primary care follow-up as he most likely needs to ice his neck and use the steroids and muscle relaxer given to him by his primary care provider and use Tylenol every 6 hours.  I also talked to the  patient about possibly reenrolling in physical therapy for his pain if it persists.  X-ray came back without acute findings.  Will discharge with the above plan.  Patient was given return precautions. Patient stable for discharge at this time.  Patient verbalized understanding of plan.  This chart was dictated using voice recognition software.  Despite best efforts to proofread,  errors can occur which can change the documentation meaning.         Final Clinical Impression(s) / ED Diagnoses Final diagnoses:  Neck pain    Rx / DC Orders ED Discharge Orders          Ordered    lidocaine (LIDODERM) 5 %  Every 24 hours        07/10/23  0272              Kaydin, Wagner, PA-C 07/10/23 5366    Pricilla Loveless, MD 07/10/23 [phone]

## 2023-07-10 NOTE — Discharge Instructions (Addendum)
Please follow-up with your primary care provider in regards recent symptoms and ER visit.  Today your x-ray is negative for any fractures and your exam shows you most likely have a muscle strain causing your pain.  Please continue the medications prescribed to you by your primary care provider and use Tylenol every 6 hours needed for pain.  Please do not use ibuprofen or Aleve while you are on the steroids as this will cause a stomach ulcer.  Please use lidocaine patches I prescribed for you and ice your neck 3-4 times daily for 15 minutes at a time.  Symptoms change or worsen please return to the ER.

## 2023-07-10 NOTE — ED Notes (Signed)
Pt was given d/c instructions and medication instructions. Pt verbalized understanding. All questions were answered at this time. Pt left w/ wife to go home

## 2023-07-20 DIAGNOSIS — M542 Cervicalgia: Secondary | ICD-10-CM | POA: Diagnosis not present

## 2023-07-20 DIAGNOSIS — M791 Myalgia, unspecified site: Secondary | ICD-10-CM | POA: Diagnosis not present

## 2023-07-20 DIAGNOSIS — M79601 Pain in right arm: Secondary | ICD-10-CM | POA: Diagnosis not present

## 2023-08-04 DIAGNOSIS — M79601 Pain in right arm: Secondary | ICD-10-CM | POA: Diagnosis not present

## 2023-08-04 DIAGNOSIS — M542 Cervicalgia: Secondary | ICD-10-CM | POA: Diagnosis not present

## 2023-08-22 DIAGNOSIS — M79601 Pain in right arm: Secondary | ICD-10-CM | POA: Diagnosis not present

## 2023-08-22 DIAGNOSIS — M542 Cervicalgia: Secondary | ICD-10-CM | POA: Diagnosis not present

## 2023-09-05 DIAGNOSIS — Z0000 Encounter for general adult medical examination without abnormal findings: Secondary | ICD-10-CM | POA: Diagnosis not present

## 2023-09-05 DIAGNOSIS — N529 Male erectile dysfunction, unspecified: Secondary | ICD-10-CM | POA: Diagnosis not present

## 2023-09-05 DIAGNOSIS — E7800 Pure hypercholesterolemia, unspecified: Secondary | ICD-10-CM | POA: Diagnosis not present

## 2023-09-05 DIAGNOSIS — G4700 Insomnia, unspecified: Secondary | ICD-10-CM | POA: Diagnosis not present

## 2023-09-05 DIAGNOSIS — Z125 Encounter for screening for malignant neoplasm of prostate: Secondary | ICD-10-CM | POA: Diagnosis not present

## 2023-09-12 DIAGNOSIS — M542 Cervicalgia: Secondary | ICD-10-CM | POA: Diagnosis not present

## 2023-09-12 DIAGNOSIS — M79601 Pain in right arm: Secondary | ICD-10-CM | POA: Diagnosis not present

## 2023-09-19 DIAGNOSIS — M79601 Pain in right arm: Secondary | ICD-10-CM | POA: Diagnosis not present

## 2023-09-19 DIAGNOSIS — M542 Cervicalgia: Secondary | ICD-10-CM | POA: Diagnosis not present

## 2023-09-27 DIAGNOSIS — M791 Myalgia, unspecified site: Secondary | ICD-10-CM | POA: Diagnosis not present

## 2023-09-27 DIAGNOSIS — M5412 Radiculopathy, cervical region: Secondary | ICD-10-CM | POA: Diagnosis not present

## 2023-10-20 DIAGNOSIS — M5412 Radiculopathy, cervical region: Secondary | ICD-10-CM | POA: Diagnosis not present

## 2023-10-27 DIAGNOSIS — M542 Cervicalgia: Secondary | ICD-10-CM | POA: Diagnosis not present

## 2023-10-27 DIAGNOSIS — M5412 Radiculopathy, cervical region: Secondary | ICD-10-CM | POA: Diagnosis not present

## 2023-10-27 DIAGNOSIS — M791 Myalgia, unspecified site: Secondary | ICD-10-CM | POA: Diagnosis not present

## 2023-10-28 DIAGNOSIS — Z6833 Body mass index (BMI) 33.0-33.9, adult: Secondary | ICD-10-CM | POA: Diagnosis not present

## 2023-10-28 DIAGNOSIS — M4712 Other spondylosis with myelopathy, cervical region: Secondary | ICD-10-CM | POA: Diagnosis not present

## 2023-10-28 DIAGNOSIS — M5412 Radiculopathy, cervical region: Secondary | ICD-10-CM | POA: Diagnosis not present

## 2023-11-11 DIAGNOSIS — M50123 Cervical disc disorder at C6-C7 level with radiculopathy: Secondary | ICD-10-CM | POA: Diagnosis not present

## 2023-11-11 DIAGNOSIS — M50022 Cervical disc disorder at C5-C6 level with myelopathy: Secondary | ICD-10-CM | POA: Diagnosis not present

## 2023-11-11 DIAGNOSIS — M4802 Spinal stenosis, cervical region: Secondary | ICD-10-CM | POA: Diagnosis not present

## 2023-11-11 DIAGNOSIS — M50121 Cervical disc disorder at C4-C5 level with radiculopathy: Secondary | ICD-10-CM | POA: Diagnosis not present

## 2023-11-11 DIAGNOSIS — M50021 Cervical disc disorder at C4-C5 level with myelopathy: Secondary | ICD-10-CM | POA: Diagnosis not present

## 2023-11-11 DIAGNOSIS — M50122 Cervical disc disorder at C5-C6 level with radiculopathy: Secondary | ICD-10-CM | POA: Diagnosis not present

## 2023-11-11 DIAGNOSIS — M50023 Cervical disc disorder at C6-C7 level with myelopathy: Secondary | ICD-10-CM | POA: Diagnosis not present

## 2023-11-11 DIAGNOSIS — M4712 Other spondylosis with myelopathy, cervical region: Secondary | ICD-10-CM | POA: Diagnosis not present

## 2023-12-21 DIAGNOSIS — L910 Hypertrophic scar: Secondary | ICD-10-CM | POA: Diagnosis not present

## 2023-12-21 DIAGNOSIS — L42 Pityriasis rosea: Secondary | ICD-10-CM | POA: Diagnosis not present

## 2024-01-02 DIAGNOSIS — M4712 Other spondylosis with myelopathy, cervical region: Secondary | ICD-10-CM | POA: Diagnosis not present

## 2024-01-19 DIAGNOSIS — M542 Cervicalgia: Secondary | ICD-10-CM | POA: Diagnosis not present

## 2024-02-01 DIAGNOSIS — M542 Cervicalgia: Secondary | ICD-10-CM | POA: Diagnosis not present

## 2024-02-08 DIAGNOSIS — M542 Cervicalgia: Secondary | ICD-10-CM | POA: Diagnosis not present

## 2024-02-14 DIAGNOSIS — M542 Cervicalgia: Secondary | ICD-10-CM | POA: Diagnosis not present

## 2024-02-22 DIAGNOSIS — M542 Cervicalgia: Secondary | ICD-10-CM | POA: Diagnosis not present

## 2024-03-07 DIAGNOSIS — R21 Rash and other nonspecific skin eruption: Secondary | ICD-10-CM | POA: Diagnosis not present

## 2024-03-07 DIAGNOSIS — Z23 Encounter for immunization: Secondary | ICD-10-CM | POA: Diagnosis not present

## 2024-03-22 DIAGNOSIS — L910 Hypertrophic scar: Secondary | ICD-10-CM | POA: Diagnosis not present

## 2024-03-22 DIAGNOSIS — L309 Dermatitis, unspecified: Secondary | ICD-10-CM | POA: Diagnosis not present

## 2024-04-02 DIAGNOSIS — M4712 Other spondylosis with myelopathy, cervical region: Secondary | ICD-10-CM | POA: Diagnosis not present

## 2024-04-02 DIAGNOSIS — Z6833 Body mass index (BMI) 33.0-33.9, adult: Secondary | ICD-10-CM | POA: Diagnosis not present

## 2024-04-02 DIAGNOSIS — M5412 Radiculopathy, cervical region: Secondary | ICD-10-CM | POA: Diagnosis not present
